# Patient Record
Sex: Female | Born: 1985 | Race: White | Hispanic: Yes | Marital: Married | Smoking: Never smoker
Health system: Southern US, Community
[De-identification: ages and names within clinical notes are randomized; demographics above are authoritative.]

## PROBLEM LIST (undated history)

## (undated) DIAGNOSIS — Z789 Other specified health status: Secondary | ICD-10-CM

## (undated) DIAGNOSIS — D649 Anemia, unspecified: Secondary | ICD-10-CM

---

## 2008-04-05 ENCOUNTER — Inpatient Hospital Stay (HOSPITAL_COMMUNITY): Admission: AD | Admit: 2008-04-05 | Discharge: 2008-04-05 | Payer: Self-pay | Admitting: Obstetrics and Gynecology

## 2008-05-29 ENCOUNTER — Ambulatory Visit (HOSPITAL_COMMUNITY): Admission: RE | Admit: 2008-05-29 | Discharge: 2008-05-29 | Payer: Self-pay | Admitting: Obstetrics & Gynecology

## 2008-06-29 ENCOUNTER — Ambulatory Visit (HOSPITAL_COMMUNITY): Admission: RE | Admit: 2008-06-29 | Discharge: 2008-06-29 | Payer: Self-pay | Admitting: Obstetrics & Gynecology

## 2008-08-19 ENCOUNTER — Ambulatory Visit (HOSPITAL_COMMUNITY): Admission: RE | Admit: 2008-08-19 | Discharge: 2008-08-19 | Payer: Self-pay | Admitting: Family Medicine

## 2008-08-26 ENCOUNTER — Ambulatory Visit: Payer: Self-pay | Admitting: Obstetrics and Gynecology

## 2008-08-26 ENCOUNTER — Inpatient Hospital Stay (HOSPITAL_COMMUNITY): Admission: AD | Admit: 2008-08-26 | Discharge: 2008-08-26 | Payer: Self-pay | Admitting: Family Medicine

## 2008-09-03 ENCOUNTER — Ambulatory Visit (HOSPITAL_COMMUNITY): Admission: RE | Admit: 2008-09-03 | Discharge: 2008-09-03 | Payer: Self-pay | Admitting: Obstetrics & Gynecology

## 2008-09-03 ENCOUNTER — Ambulatory Visit: Payer: Self-pay | Admitting: Family Medicine

## 2008-09-07 ENCOUNTER — Ambulatory Visit: Payer: Self-pay | Admitting: Obstetrics & Gynecology

## 2008-09-10 ENCOUNTER — Ambulatory Visit: Payer: Self-pay | Admitting: Family Medicine

## 2008-09-17 ENCOUNTER — Ambulatory Visit: Payer: Self-pay | Admitting: Family Medicine

## 2008-09-24 ENCOUNTER — Ambulatory Visit: Payer: Self-pay | Admitting: Family Medicine

## 2008-09-24 ENCOUNTER — Inpatient Hospital Stay (HOSPITAL_COMMUNITY): Admission: AD | Admit: 2008-09-24 | Discharge: 2008-09-27 | Payer: Self-pay | Admitting: Obstetrics & Gynecology

## 2008-09-24 ENCOUNTER — Ambulatory Visit: Payer: Self-pay | Admitting: Obstetrics and Gynecology

## 2008-11-06 ENCOUNTER — Inpatient Hospital Stay (HOSPITAL_COMMUNITY): Admission: AD | Admit: 2008-11-06 | Discharge: 2008-11-06 | Payer: Self-pay | Admitting: Obstetrics and Gynecology

## 2008-11-06 ENCOUNTER — Ambulatory Visit: Payer: Self-pay | Admitting: Family Medicine

## 2010-03-26 IMAGING — US US OB FOLLOW-UP
1 series · 14 of 28 positions shown · non-contrast
Comparison: none

OBSTETRICAL ULTRASOUND:
 This ultrasound exam was performed in the [HOSPITAL] Ultrasound Department.  The OB US report was generated in the AS system, and faxed to the ordering physician.  This report is also available in [REDACTED] PACS.

[Series 1: us ob re-eval · 36 acquisitions, 14 frames shown]
[im 2/36]
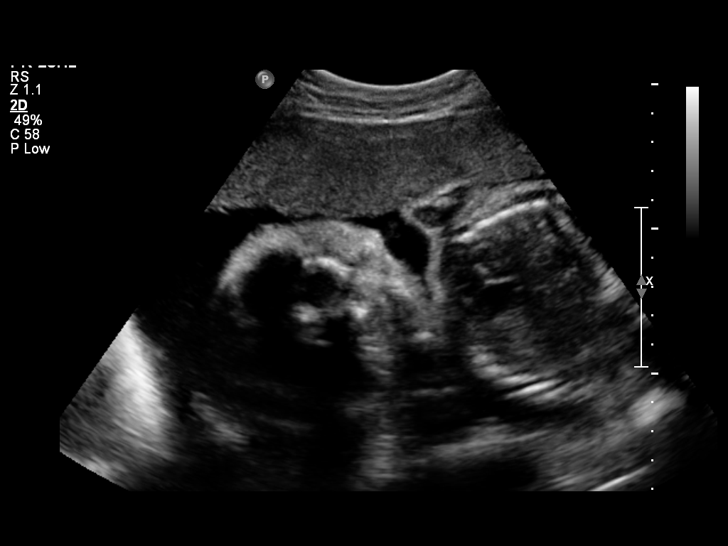
[im 4/36]
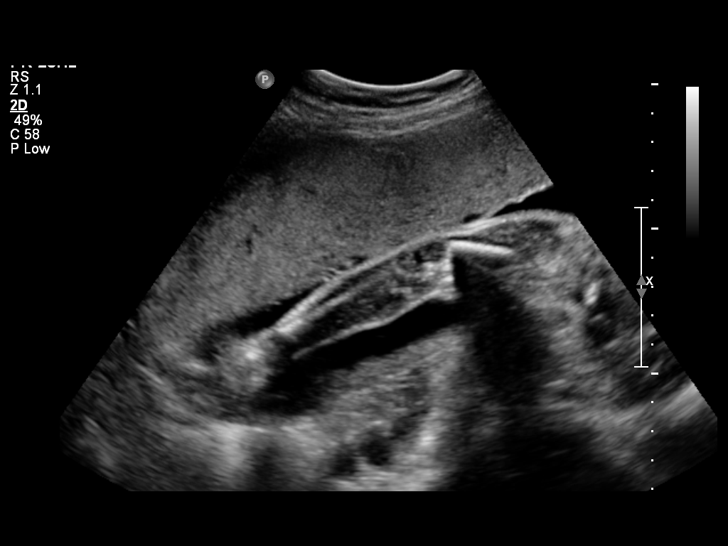
[im 7/36]
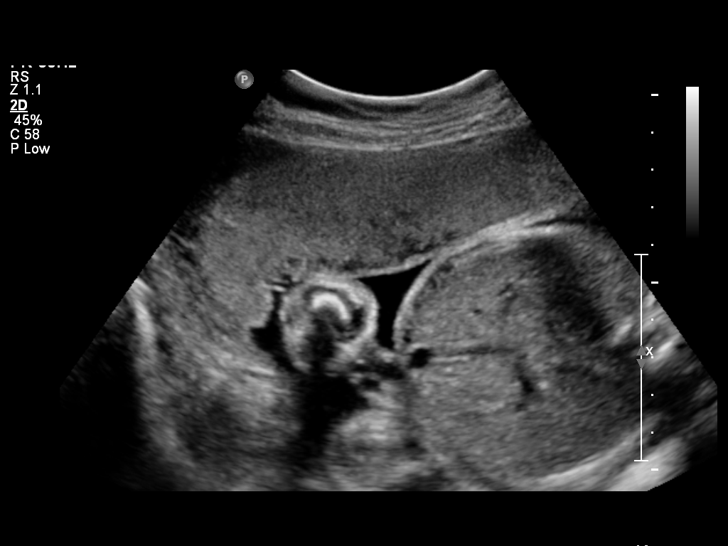
[im 10/36]
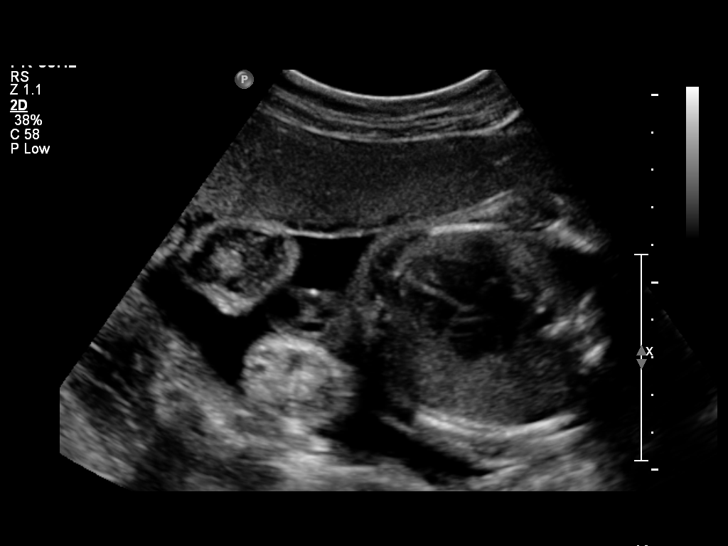
[im 12/36]
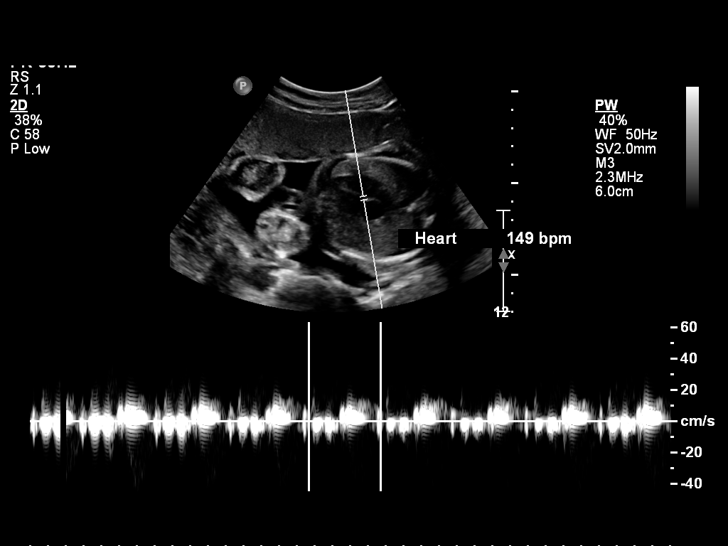
[im 15/36]
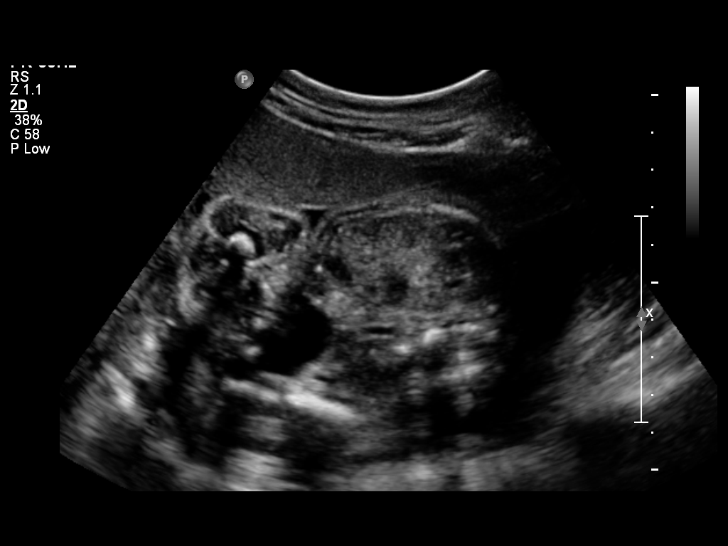
[im 17/36]
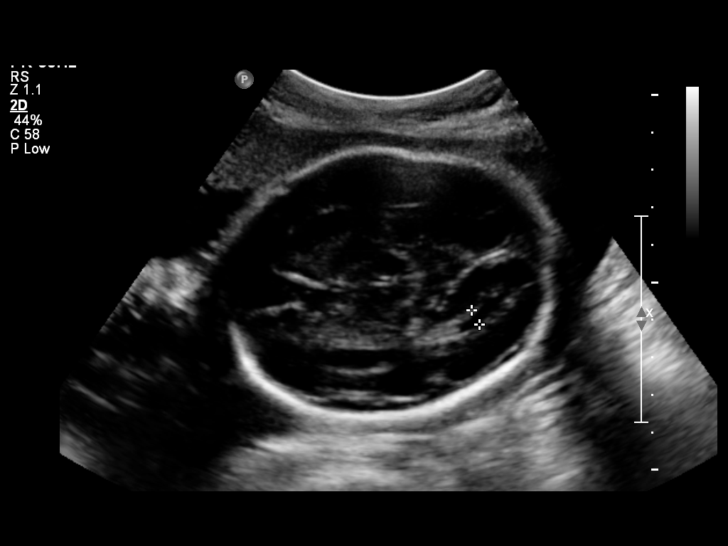
[im 20/36]
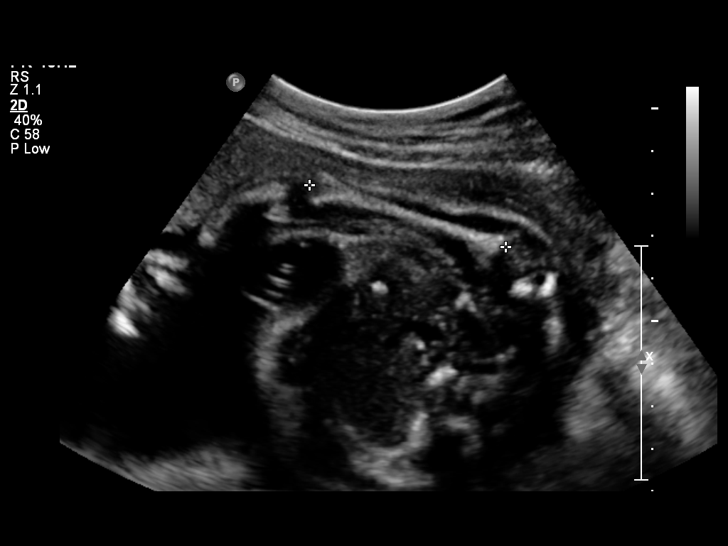
[im 23/36]
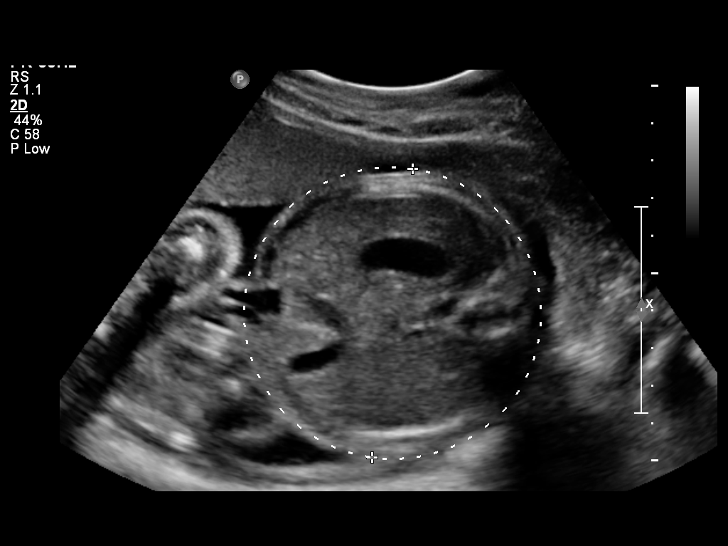
[im 25/36]
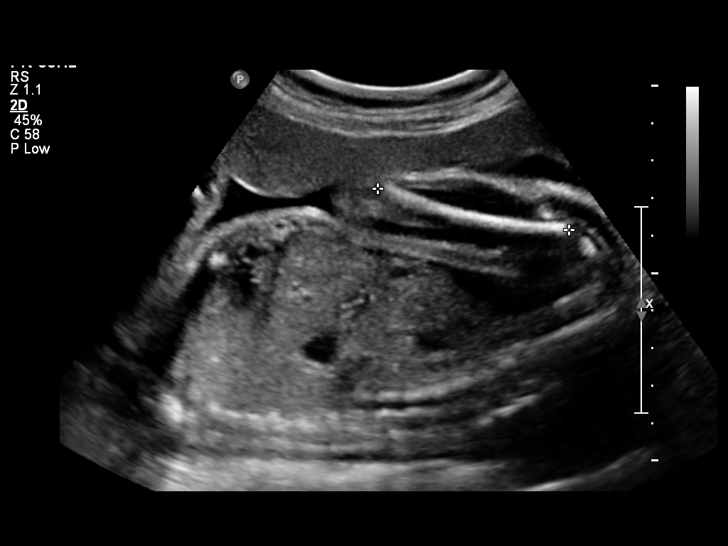
[im 28/36]
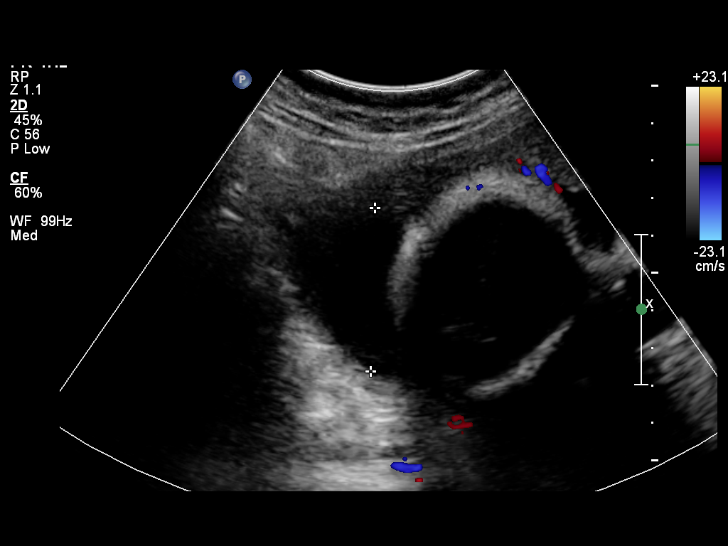
[im 30/36]
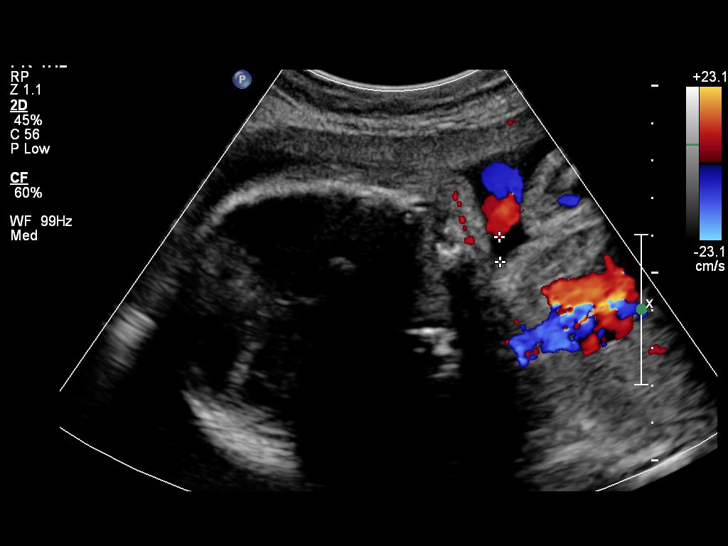
[im 33/36]
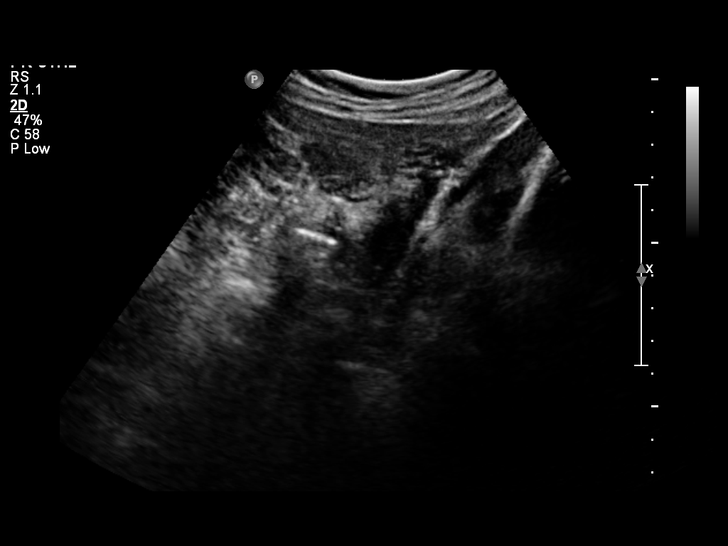
[im 36/36]
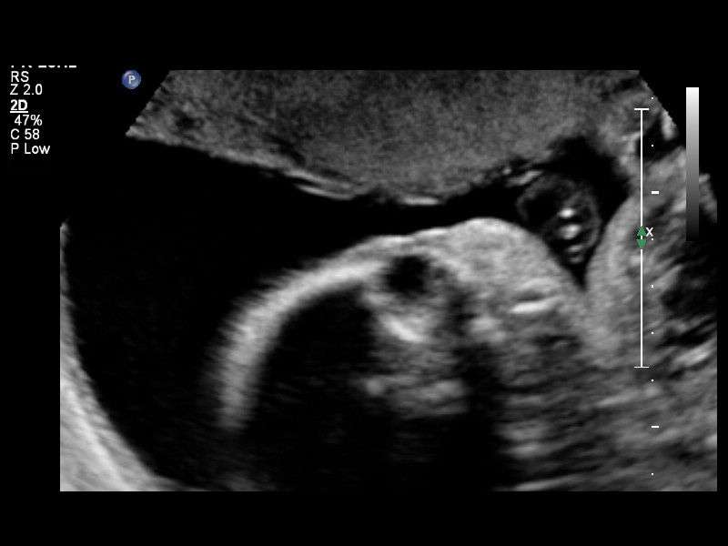

[14 of 28 positions shown; findings below may reference images not displayed]

IMPRESSION: See AS Obstetric US report.

## 2010-12-15 LAB — URINALYSIS, ROUTINE W REFLEX MICROSCOPIC
Nitrite: NEGATIVE
Specific Gravity, Urine: >1.03 — ABNORMAL HIGH (ref 1.005–1.030)
Urobilinogen, UA: 0.2 mg/dL (ref 0.0–1.0)
pH: 6 (ref 5.0–8.0)

## 2010-12-19 LAB — POCT URINALYSIS DIP (DEVICE)
Bilirubin Urine: NEGATIVE
Bilirubin Urine: NEGATIVE
Glucose, UA: NEGATIVE mg/dL
Glucose, UA: NEGATIVE mg/dL
Glucose, UA: NEGATIVE mg/dL
Hgb urine dipstick: NEGATIVE
Hgb urine dipstick: NEGATIVE
Ketones, ur: NEGATIVE mg/dL
Nitrite: NEGATIVE
Protein, ur: NEGATIVE mg/dL
Protein, ur: NEGATIVE mg/dL
Specific Gravity, Urine: 1.015 (ref 1.005–1.030)
pH: 7 (ref 5.0–8.0)

## 2010-12-19 LAB — CBC
MCHC: 33.9 g/dL (ref 30.0–36.0)
RBC: 3.61 MIL/uL — ABNORMAL LOW (ref 3.87–5.11)
RDW: 13.4 % (ref 11.5–15.5)

## 2010-12-19 LAB — RPR: RPR Ser Ql: NONREACTIVE

## 2011-06-02 LAB — URINALYSIS, ROUTINE W REFLEX MICROSCOPIC
Bilirubin Urine: NEGATIVE
Glucose, UA: NEGATIVE
Nitrite: NEGATIVE
Specific Gravity, Urine: 1.02

## 2011-06-09 LAB — POCT URINALYSIS DIP (DEVICE)
Glucose, UA: NEGATIVE mg/dL
Hgb urine dipstick: NEGATIVE
Ketones, ur: NEGATIVE mg/dL
Nitrite: NEGATIVE
Protein, ur: NEGATIVE mg/dL
Specific Gravity, Urine: 1.01 (ref 1.005–1.030)
pH: 7 (ref 5.0–8.0)

## 2012-07-23 ENCOUNTER — Other Ambulatory Visit (HOSPITAL_COMMUNITY): Payer: Self-pay | Admitting: Physician Assistant

## 2012-07-23 DIAGNOSIS — Z3689 Encounter for other specified antenatal screening: Secondary | ICD-10-CM

## 2012-07-23 LAB — OB RESULTS CONSOLE ANTIBODY SCREEN: Antibody Screen: NEGATIVE

## 2012-07-23 LAB — OB RESULTS CONSOLE RUBELLA ANTIBODY, IGM: Rubella: IMMUNE

## 2012-07-23 LAB — OB RESULTS CONSOLE GC/CHLAMYDIA
Chlamydia: NEGATIVE
Gonorrhea: NEGATIVE

## 2012-07-23 LAB — OB RESULTS CONSOLE HIV ANTIBODY (ROUTINE TESTING): HIV: NONREACTIVE

## 2012-07-24 ENCOUNTER — Ambulatory Visit (HOSPITAL_COMMUNITY)
Admission: RE | Admit: 2012-07-24 | Discharge: 2012-07-24 | Disposition: A | Discharge status: Home / Self Care | Payer: Self-pay | Source: Ambulatory Visit | Attending: Physician Assistant | Admitting: Physician Assistant

## 2012-07-24 ENCOUNTER — Encounter (HOSPITAL_COMMUNITY): Payer: Self-pay

## 2012-07-24 DIAGNOSIS — Z363 Encounter for antenatal screening for malformations: Secondary | ICD-10-CM | POA: Insufficient documentation

## 2012-07-24 DIAGNOSIS — Z1389 Encounter for screening for other disorder: Secondary | ICD-10-CM | POA: Insufficient documentation

## 2012-07-24 DIAGNOSIS — O0930 Supervision of pregnancy with insufficient antenatal care, unspecified trimester: Secondary | ICD-10-CM | POA: Insufficient documentation

## 2012-07-24 DIAGNOSIS — O358XX0 Maternal care for other (suspected) fetal abnormality and damage, not applicable or unspecified: Secondary | ICD-10-CM | POA: Insufficient documentation

## 2012-07-24 DIAGNOSIS — Z3689 Encounter for other specified antenatal screening: Secondary | ICD-10-CM

## 2012-09-04 NOTE — L&D Delivery Note (Signed)
Attestation of Attending Supervision of Advanced Practitioner: Evaluation and management procedures were performed by the PA/NP/CNM/OB Fellow under my supervision/collaboration. Chart reviewed and agree with management and plan.  Aino Heckert V 10/26/2012 5:50 AM   

## 2012-09-04 NOTE — L&D Delivery Note (Signed)
Haley Lin is a 27 y.o. G2P1001 presenting at [redacted]w[redacted]d by LMP presenting contracting and 8 cm dilated. Pt noted to have anterior lip in L&D room, AROM, clear, progressed to complete and pushing within minutes.  Delivery Note At 6:26 PM a viable female was delivered via Vaginal, Spontaneous Delivery (Presentation: vertex; ROA).  APGAR: 9,9; weight pending.   Placenta status: Intact, Spontaneous.  Cord: 3 vessels, Short.  Cord pH: n/a.  Complications:  none  Anesthesia: None  Episiotomy: None Lacerations: None Suture Repair: n/a Est. Blood Loss (mL): 300  Mom to postpartum.  Baby to nursery-stable.  Napoleon Form 10/25/2012, 7:04 PM

## 2012-10-25 ENCOUNTER — Inpatient Hospital Stay (HOSPITAL_COMMUNITY)
Admission: AD | Admit: 2012-10-25 | Discharge: 2012-10-27 | DRG: 775 | Disposition: A | Discharge status: Home / Self Care | Payer: Medicaid Other | Source: Ambulatory Visit | Attending: Obstetrics and Gynecology | Admitting: Obstetrics and Gynecology

## 2012-10-25 ENCOUNTER — Encounter (HOSPITAL_COMMUNITY): Payer: Self-pay | Admitting: *Deleted

## 2012-10-25 HISTORY — DX: Other specified health status: Z78.9

## 2012-10-25 LAB — COMPREHENSIVE METABOLIC PANEL
BUN: 9 mg/dL (ref 6–23)
CO2: 20 mEq/L (ref 19–32)
Chloride: 102 mEq/L (ref 96–112)
Creatinine, Ser: 0.64 mg/dL (ref 0.50–1.10)
GFR calc non Af Amer: >90 mL/min (ref >90)
Total Bilirubin: 0.4 mg/dL (ref 0.3–1.2)

## 2012-10-25 LAB — CBC
HCT: 35.9 % — ABNORMAL LOW (ref 36.0–46.0)
MCHC: 34.5 g/dL (ref 30.0–36.0)
MCV: 83.9 fL (ref 78.0–100.0)
RDW: 13.2 % (ref 11.5–15.5)

## 2012-10-25 LAB — ABO/RH: ABO/RH(D): O POS

## 2012-10-25 LAB — PROTEIN / CREATININE RATIO, URINE
Creatinine, Urine: 22.83 mg/dL
Total Protein, Urine: 22.4 mg/dL

## 2012-10-25 MED ORDER — FLEET ENEMA 7-19 GM/118ML RE ENEM: 1.0000 | ENEMA | Freq: Every day | RECTAL | Status: DC | PRN

## 2012-10-25 MED ORDER — SIMETHICONE 80 MG PO CHEW: 80.0000 mg | CHEWABLE_TABLET | ORAL | Status: DC | PRN

## 2012-10-25 MED ORDER — WITCH HAZEL-GLYCERIN EX PADS: 1.0000 "application " | MEDICATED_PAD | CUTANEOUS | Status: DC | PRN

## 2012-10-25 MED ORDER — MEASLES, MUMPS & RUBELLA VAC ~~LOC~~ INJ: 0.5000 mL | INJECTION | Freq: Once | SUBCUTANEOUS | Status: DC

## 2012-10-25 MED ORDER — IBUPROFEN 600 MG PO TABS
600.0000 mg | ORAL_TABLET | Freq: Four times a day (QID) | ORAL | Status: DC | PRN
  Filled 2012-10-25: qty 1

## 2012-10-25 MED ORDER — ONDANSETRON HCL 4 MG/2ML IJ SOLN: 4.0000 mg | Freq: Four times a day (QID) | INTRAMUSCULAR | Status: DC | PRN

## 2012-10-25 MED ORDER — LACTATED RINGERS IV SOLN: INTRAVENOUS | Status: DC

## 2012-10-25 MED ORDER — ONDANSETRON HCL 4 MG PO TABS: 4.0000 mg | ORAL_TABLET | ORAL | Status: DC | PRN

## 2012-10-25 MED ORDER — DIBUCAINE 1 % RE OINT: 1.0000 "application " | TOPICAL_OINTMENT | RECTAL | Status: DC | PRN

## 2012-10-25 MED ORDER — ONDANSETRON HCL 4 MG/2ML IJ SOLN: 4.0000 mg | INTRAMUSCULAR | Status: DC | PRN

## 2012-10-25 MED ORDER — MISOPROSTOL 200 MCG PO TABS
800.0000 ug | ORAL_TABLET | Freq: Once | ORAL | Status: AC
  Administered 2012-10-25: 800 ug via VAGINAL

## 2012-10-25 MED ORDER — ZOLPIDEM TARTRATE 5 MG PO TABS: 5.0000 mg | ORAL_TABLET | Freq: Every evening | ORAL | Status: DC | PRN

## 2012-10-25 MED ORDER — FLEET ENEMA 7-19 GM/118ML RE ENEM: 1.0000 | ENEMA | RECTAL | Status: DC | PRN

## 2012-10-25 MED ORDER — OXYTOCIN 40 UNITS IN LACTATED RINGERS INFUSION - SIMPLE MED: 62.5000 mL/h | INTRAVENOUS | Status: DC | PRN

## 2012-10-25 MED ORDER — BENZOCAINE-MENTHOL 20-0.5 % EX AERO: 1.0000 "application " | INHALATION_SPRAY | CUTANEOUS | Status: DC | PRN

## 2012-10-25 MED ORDER — OXYCODONE-ACETAMINOPHEN 5-325 MG PO TABS: 1.0000 | ORAL_TABLET | ORAL | Status: DC | PRN

## 2012-10-25 MED ORDER — OXYTOCIN BOLUS FROM INFUSION: 500.0000 mL | INTRAVENOUS | Status: DC

## 2012-10-25 MED ORDER — TETANUS-DIPHTH-ACELL PERTUSSIS 5-2.5-18.5 LF-MCG/0.5 IM SUSP: 0.5000 mL | Freq: Once | INTRAMUSCULAR | Status: DC

## 2012-10-25 MED ORDER — MISOPROSTOL 200 MCG PO TABS
ORAL_TABLET | ORAL | Status: AC
  Filled 2012-10-25: qty 4

## 2012-10-25 MED ORDER — CITRIC ACID-SODIUM CITRATE 334-500 MG/5ML PO SOLN: 30.0000 mL | ORAL | Status: DC | PRN

## 2012-10-25 MED ORDER — PRENATAL MULTIVITAMIN CH
1.0000 | ORAL_TABLET | Freq: Every day | ORAL | Status: DC
  Administered 2012-10-26 – 2012-10-27 (×2): 1 via ORAL
  Filled 2012-10-25 (×2): qty 1

## 2012-10-25 MED ORDER — LACTATED RINGERS IV SOLN: 500.0000 mL | INTRAVENOUS | Status: DC | PRN

## 2012-10-25 MED ORDER — OXYTOCIN 40 UNITS IN LACTATED RINGERS INFUSION - SIMPLE MED
62.5000 mL/h | INTRAVENOUS | Status: DC
  Administered 2012-10-25: 62.5 mL/h via INTRAVENOUS

## 2012-10-25 MED ORDER — FERROUS SULFATE 325 (65 FE) MG PO TABS
325.0000 mg | ORAL_TABLET | Freq: Two times a day (BID) | ORAL | Status: DC
  Administered 2012-10-26 – 2012-10-27 (×3): 325 mg via ORAL
  Filled 2012-10-25 (×3): qty 1

## 2012-10-25 MED ORDER — ACETAMINOPHEN 325 MG PO TABS: 650.0000 mg | ORAL_TABLET | ORAL | Status: DC | PRN

## 2012-10-25 MED ORDER — BISACODYL 10 MG RE SUPP: 10.0000 mg | Freq: Every day | RECTAL | Status: DC | PRN

## 2012-10-25 MED ORDER — LIDOCAINE HCL (PF) 1 % IJ SOLN: 30.0000 mL | INTRAMUSCULAR | Status: DC | PRN

## 2012-10-25 MED ORDER — NALBUPHINE SYRINGE 5 MG/0.5 ML: 10.0000 mg | INJECTION | INTRAMUSCULAR | Status: DC | PRN

## 2012-10-25 MED ORDER — OXYTOCIN 40 UNITS IN LACTATED RINGERS INFUSION - SIMPLE MED
INTRAVENOUS | Status: AC
  Filled 2012-10-25: qty 1000

## 2012-10-25 MED ORDER — SENNOSIDES-DOCUSATE SODIUM 8.6-50 MG PO TABS
2.0000 | ORAL_TABLET | Freq: Every day | ORAL | Status: DC
  Administered 2012-10-25 – 2012-10-26 (×2): 2 via ORAL

## 2012-10-25 MED ORDER — LANOLIN HYDROUS EX OINT: TOPICAL_OINTMENT | CUTANEOUS | Status: DC | PRN

## 2012-10-25 MED ORDER — DIPHENHYDRAMINE HCL 25 MG PO CAPS: 25.0000 mg | ORAL_CAPSULE | Freq: Four times a day (QID) | ORAL | Status: DC | PRN

## 2012-10-25 MED ORDER — IBUPROFEN 600 MG PO TABS
600.0000 mg | ORAL_TABLET | Freq: Four times a day (QID) | ORAL | Status: DC
  Administered 2012-10-25 – 2012-10-27 (×8): 600 mg via ORAL
  Filled 2012-10-25 (×7): qty 1

## 2012-10-25 MED ORDER — LIDOCAINE HCL (PF) 1 % IJ SOLN
INTRAMUSCULAR | Status: AC
  Filled 2012-10-25: qty 30

## 2012-10-25 NOTE — MAU Note (Signed)
Interpreter paged, will come help RN's triage pt in room. Pt states back pain, denies lof or bleeding. U/c's began earlier today.

## 2012-10-25 NOTE — H&P (Signed)
Haley Lin is a 27 y.o. female G2P1001 at [redacted]w[redacted]d by LMP presenting for contractions starting at 5 AM and increasing in intensity around 3 PM. Pt gets prenatal care at [facility]., no complication of this pregnancy or prior pregnancies.  . Maternal Medical History:  Reason for admission: Contractions.   Contractions: Onset was 13-24 hours ago.   Frequency: regular.   Perceived severity is strong.    Fetal activity: Perceived fetal activity is normal.   Last perceived fetal movement was within the past hour.    Prenatal complications: no prenatal complications Prenatal Complications - Diabetes: none.    OB History   Grav Para Term Preterm Abortions TAB SAB Ect Mult Living   2 2 2  0 0 0 0 0 0 2     Past Medical History  Diagnosis Date  . Medical history non-contributory    Past Surgical History  Procedure Laterality Date  . No past surgeries     Family History: family history is not on file. Social History:  reports that she has never smoked. She has never used smokeless tobacco. She reports that she does not drink alcohol or use illicit drugs.   Prenatal Transfer Tool  Maternal Diabetes: No Genetic Screening: Declined Maternal Ultrasounds/Referrals: Normal Fetal Ultrasounds or other Referrals:  None Maternal Substance Abuse:  No Significant Maternal Medications:  None Significant Maternal Lab Results:  Lab values include: Group B Strep negative Other Comments:  None  ROS:   See HPI  Dilation: 10 Effacement (%): 100 Station: +2 Exam by:: Thad Ranger, MC Blood pressure 133/78, pulse 71, temperature 97.2 F (36.2 C), temperature source Oral, resp. rate 20, height 5\' 2"  (1.575 m), weight 61.236 kg (135 lb), unknown if currently breastfeeding. Exam Physical Exam  Constitutional: She is oriented to person, place, and time. She appears well-developed and well-nourished. She appears distressed (pain with contractions).  HENT:  Head: Normocephalic and atraumatic.   Eyes: Conjunctivae and EOM are normal.  Neck: Normal range of motion. Neck supple.  Cardiovascular: Normal rate.   Respiratory: Effort normal. No respiratory distress.  Musculoskeletal: She exhibits no edema and no tenderness.  Neurological: She is alert and oriented to person, place, and time.  Skin: Skin is warm and dry.  Psychiatric: She has a normal mood and affect.    Cervix 8/100/0 in MAU. 9.5/100/+1 on reexam in room   Prenatal labs: ABO, Rh: O/Positive/-- (11/19 0000) Antibody: Negative (11/19 0000) Rubella: Immune (11/19 0000) RPR: Nonreactive (11/19 0000)  HBsAg: Negative (11/19 0000)  HIV: Non-reactive (11/19 0000)  GBS: Negative (02/05 0000)   Assessment/Plan: 27 y.o. G2P200 at [redacted]w[redacted]d  in active, advanced labor Admitted to L&D Labs ordered Expect SVD imminently. GBS negative  Napoleon Form 10/25/2012, 6:54 PM

## 2012-10-26 NOTE — Progress Notes (Signed)
Patient was referred for history of depression/anxiety.  * Referral screened out by Clinical Social Worker because none of the following criteria appear to apply:  ~ History of anxiety/depression during this pregnancy, or of post-partum depression.  ~ Diagnosis of anxiety and/or depression within last 3 years  ~ History of depression due to pregnancy loss/loss of child  OR  * Patient's symptoms currently being treated with medication and/or therapy.  Please contact the Clinical Social Worker if needs arise, or by the patient's request.  Pt denies history of PP depression. Conflicting information noted in chart regarding PP depression history, as per chart review.       

## 2012-10-26 NOTE — Progress Notes (Signed)
Post Partum Day 1,  Subjective: no complaints  Objective: Temp:  [97.2 F (36.2 C)-99.4 F (37.4 C)] 98.2 F (36.8 C) (02/22 0553) Pulse Rate:  [67-144] 67 (02/22 0553) Resp:  [16-20] 18 (02/22 0553) BP: (115-160)/(73-92) 118/82 mmHg (02/22 0553) Weight:  [61.236 kg (135 lb)] 61.236 kg (135 lb) (02/21 1811)  Physical Exam:  General: alert, cooperative and no distress Lochia: appropriate Uterine Fundus: firm Incision: n/a DVT Evaluation: No evidence of DVT seen on physical exam.   Recent Labs  10/25/12 1810  HGB 12.4  HCT 35.9*    Assessment/Plan: Plan for discharge tomorrow and Breastfeeding Contraception - pills    LOS: 1 day   Mat Carne 10/26/2012, 7:23 AM   Evaluation and management procedures were performed by Resident physician under my supervision/collaboration. Chart reviewed, patient examined by me and I agree with management and plan. Isolated BP elevation x 2: will follow.

## 2012-10-26 NOTE — H&P (Signed)
Attestation of Attending Supervision of Advanced Practitioner: Evaluation and management procedures were performed by the PA/NP/CNM/OB Fellow under my supervision/collaboration. Chart reviewed and agree with management and plan.  Laban Orourke V 10/26/2012 5:49 AM   

## 2012-10-27 MED ORDER — DOCUSATE SODIUM 100 MG PO CAPS: 100.0000 mg | ORAL_CAPSULE | Freq: Two times a day (BID) | ORAL | Status: DC | PRN

## 2012-10-27 MED ORDER — IBUPROFEN 600 MG PO TABS: 600.0000 mg | ORAL_TABLET | Freq: Four times a day (QID) | ORAL | Status: DC

## 2012-10-27 MED ORDER — NORETHINDRONE 0.35 MG PO TABS: 1.0000 | ORAL_TABLET | Freq: Every day | ORAL | Status: DC

## 2012-10-27 NOTE — Discharge Summary (Signed)
Attestation of Attending Supervision of Advanced Practitioner: Evaluation and management procedures were performed by the PA/NP/CNM/OB Fellow under my supervision/collaboration. Chart reviewed and agree with management and plan.  Tilda Burrow 10/27/2012 10:33 PM

## 2012-10-27 NOTE — Discharge Summary (Signed)
Obstetric Discharge Summary Haley Lin is a 27 y.o. [id number] presenting at [redacted]w[redacted]d in active labor at 9 cm. She has a NSVD with no complications and uneventful postpartum course. [facility] She is breastfeeding and plans OCPs for contraception.  Reason for Admission: onset of labor Prenatal Procedures: none Intrapartum Procedures: spontaneous vaginal delivery Postpartum Procedures: none Complications-Operative and Postpartum: none Hemoglobin  Date Value Range Status  10/25/2012 12.4  12.0 - 15.0 g/dL Final     HCT  Date Value Range Status  10/25/2012 35.9* 36.0 - 46.0 % Final    Physical Exam:  General: alert, cooperative and no distress Lochia: appropriate Uterine Fundus: firm Incision: n/a DVT Evaluation: No evidence of DVT seen on physical exam. Negative Homan's sign. No cords or calf tenderness. No significant calf/ankle edema.  Discharge Diagnoses: Term Pregnancy-delivered  Discharge Information: Date: 10/27/2012 Activity: pelvic rest Diet: routine Medications: PNV, Ibuprofen and Colace, Micronor Condition: stable Instructions: refer to practice specific booklet Discharge to: home Follow-up Information   Follow up with [facility] [place] In 6 weeks.   Contact information:   [address] [place] [address] [phone]      Newborn Data: Live born female  Birth Weight: 7 lb 10.6 oz (3475 g) APGAR: 9,   Home with mother.  Napoleon Form 10/27/2012, 8:47 AM

## 2012-10-28 NOTE — Progress Notes (Signed)
Post discharge chart review completed.  

## 2012-11-06 ENCOUNTER — Telehealth (HOSPITAL_COMMUNITY): Payer: Self-pay | Admitting: *Deleted

## 2012-11-06 NOTE — Telephone Encounter (Signed)
Resolve episode 

## 2014-07-06 ENCOUNTER — Encounter (HOSPITAL_COMMUNITY): Payer: Self-pay | Admitting: *Deleted

## 2015-03-16 ENCOUNTER — Other Ambulatory Visit: Payer: Self-pay | Admitting: "Women's Health Care

## 2015-03-16 DIAGNOSIS — N63 Unspecified lump in unspecified breast: Secondary | ICD-10-CM

## 2015-03-24 ENCOUNTER — Ambulatory Visit
Admission: RE | Admit: 2015-03-24 | Discharge: 2015-03-24 | Disposition: A | Discharge status: Home / Self Care | Payer: No Typology Code available for payment source | Source: Ambulatory Visit | Attending: "Women's Health Care | Admitting: "Women's Health Care

## 2015-03-24 DIAGNOSIS — N63 Unspecified lump in unspecified breast: Secondary | ICD-10-CM

## 2015-09-05 NOTE — L&D Delivery Note (Signed)
Patient is 30 y.o. [id number] 5360w1d admitted for SOL.  Delivery Note At 12:57 PM a viable female was delivered via Vaginal, Spontaneous Delivery (Presentation: ROA ).  APGAR: 9, 9; weight  pending.   Placenta status: intact.  Cord: 3 vessel. Without complications.  Anesthesia:  none Episiotomy: None Lacerations: None Suture Repair: none Est. Blood Loss (mL):  100mL  Mom to postpartum.  Baby to Couplet care / Skin to Skin.  Leland HerElsia J Lynnet Hefley 05/15/2016, 1:10 PM      Upon arrival patient was complete and pushing. She pushed with good maternal effort to deliver a healthy baby boy. Baby delivered without difficulty, was noted to have good tone and place on maternal abdomen for oral suctioning, drying and stimulation. Delayed cord clamping performed. Placenta delivered intact with 3V cord. Vaginal canal and perineum was inspected and no repairs deemed necessary; hemostatic. Pitocin was started and uterus massaged until bleeding slowed. Counts of sharps, instruments, and lap pads were all correct.   Leland HerElsia J Brevyn Ring, DO PGY-1 9/11/20171:10 PM

## 2015-11-15 LAB — OB RESULTS CONSOLE GBS: STREP GROUP B AG: POSITIVE

## 2015-11-16 ENCOUNTER — Other Ambulatory Visit (HOSPITAL_COMMUNITY): Payer: Self-pay | Admitting: Nurse Practitioner

## 2015-11-16 DIAGNOSIS — Z3682 Encounter for antenatal screening for nuchal translucency: Secondary | ICD-10-CM

## 2015-11-16 DIAGNOSIS — Z3A13 13 weeks gestation of pregnancy: Secondary | ICD-10-CM

## 2015-11-19 ENCOUNTER — Encounter (HOSPITAL_COMMUNITY): Payer: Self-pay

## 2015-11-19 ENCOUNTER — Ambulatory Visit (HOSPITAL_COMMUNITY)
Admission: RE | Admit: 2015-11-19 | Discharge: 2015-11-19 | Disposition: A | Discharge status: Home / Self Care | Payer: Medicaid Other | Source: Ambulatory Visit | Attending: Nurse Practitioner | Admitting: Nurse Practitioner

## 2015-11-19 DIAGNOSIS — Z36 Encounter for antenatal screening of mother: Secondary | ICD-10-CM | POA: Insufficient documentation

## 2015-11-19 DIAGNOSIS — Z3A13 13 weeks gestation of pregnancy: Secondary | ICD-10-CM | POA: Insufficient documentation

## 2015-11-19 DIAGNOSIS — Z3682 Encounter for antenatal screening for nuchal translucency: Secondary | ICD-10-CM

## 2015-11-25 ENCOUNTER — Other Ambulatory Visit (HOSPITAL_COMMUNITY): Payer: Self-pay

## 2016-05-15 ENCOUNTER — Inpatient Hospital Stay (HOSPITAL_COMMUNITY)
Admission: AD | Admit: 2016-05-15 | Discharge: 2016-05-17 | DRG: 775 | Disposition: A | Discharge status: Home / Self Care | Payer: Medicaid Other | Source: Ambulatory Visit | Attending: Family Medicine | Admitting: Family Medicine

## 2016-05-15 ENCOUNTER — Encounter (HOSPITAL_COMMUNITY): Payer: Self-pay | Admitting: *Deleted

## 2016-05-15 DIAGNOSIS — IMO0001 Reserved for inherently not codable concepts without codable children: Secondary | ICD-10-CM

## 2016-05-15 DIAGNOSIS — O99824 Streptococcus B carrier state complicating childbirth: Secondary | ICD-10-CM | POA: Diagnosis present

## 2016-05-15 DIAGNOSIS — Z3A39 39 weeks gestation of pregnancy: Secondary | ICD-10-CM

## 2016-05-15 DIAGNOSIS — Z3483 Encounter for supervision of other normal pregnancy, third trimester: Secondary | ICD-10-CM | POA: Diagnosis present

## 2016-05-15 LAB — CBC
HEMATOCRIT: 33.4 % — AB (ref 36.0–46.0)
Hemoglobin: 11.7 g/dL — ABNORMAL LOW (ref 12.0–15.0)
MCH: 28.9 pg (ref 26.0–34.0)
MCHC: 35 g/dL (ref 30.0–36.0)
MCV: 82.5 fL (ref 78.0–100.0)
PLATELETS: 261 10*3/uL (ref 150–400)
RBC: 4.05 MIL/uL (ref 3.87–5.11)
RDW: 13.5 % (ref 11.5–15.5)
WBC: 13.5 10*3/uL — AB (ref 4.0–10.5)

## 2016-05-15 LAB — TYPE AND SCREEN
ABO/RH(D): O POS
Antibody Screen: NEGATIVE

## 2016-05-15 MED ORDER — SIMETHICONE 80 MG PO CHEW: 80.0000 mg | CHEWABLE_TABLET | ORAL | Status: DC | PRN

## 2016-05-15 MED ORDER — ONDANSETRON HCL 4 MG PO TABS: 4.0000 mg | ORAL_TABLET | ORAL | Status: DC | PRN

## 2016-05-15 MED ORDER — OXYTOCIN BOLUS FROM INFUSION: 500.0000 mL | Freq: Once | INTRAVENOUS | Status: DC

## 2016-05-15 MED ORDER — ZOLPIDEM TARTRATE 5 MG PO TABS: 5.0000 mg | ORAL_TABLET | Freq: Every evening | ORAL | Status: DC | PRN

## 2016-05-15 MED ORDER — ACETAMINOPHEN 325 MG PO TABS: 650.0000 mg | ORAL_TABLET | ORAL | Status: DC | PRN

## 2016-05-15 MED ORDER — ONDANSETRON HCL 4 MG/2ML IJ SOLN: 4.0000 mg | Freq: Four times a day (QID) | INTRAMUSCULAR | Status: DC | PRN

## 2016-05-15 MED ORDER — LACTATED RINGERS IV SOLN
INTRAVENOUS | Status: DC
  Administered 2016-05-15: 12:00:00 via INTRAVENOUS

## 2016-05-15 MED ORDER — FENTANYL CITRATE (PF) 100 MCG/2ML IJ SOLN
INTRAMUSCULAR | Status: AC
  Administered 2016-05-15: 100 ug via INTRAVENOUS
  Filled 2016-05-15: qty 2

## 2016-05-15 MED ORDER — COCONUT OIL OIL: 1.0000 "application " | TOPICAL_OIL | Status: DC | PRN

## 2016-05-15 MED ORDER — LIDOCAINE HCL (PF) 1 % IJ SOLN
INTRAMUSCULAR | Status: AC
  Filled 2016-05-15: qty 30

## 2016-05-15 MED ORDER — TETANUS-DIPHTH-ACELL PERTUSSIS 5-2.5-18.5 LF-MCG/0.5 IM SUSP: 0.5000 mL | Freq: Once | INTRAMUSCULAR | Status: DC

## 2016-05-15 MED ORDER — FENTANYL CITRATE (PF) 100 MCG/2ML IJ SOLN
100.0000 ug | INTRAMUSCULAR | Status: DC | PRN
  Administered 2016-05-15: 100 ug via INTRAVENOUS

## 2016-05-15 MED ORDER — DIPHENHYDRAMINE HCL 25 MG PO CAPS: 25.0000 mg | ORAL_CAPSULE | Freq: Four times a day (QID) | ORAL | Status: DC | PRN

## 2016-05-15 MED ORDER — LACTATED RINGERS IV SOLN: 500.0000 mL | INTRAVENOUS | Status: DC | PRN

## 2016-05-15 MED ORDER — LIDOCAINE HCL (PF) 1 % IJ SOLN
30.0000 mL | INTRAMUSCULAR | Status: DC | PRN
  Filled 2016-05-15: qty 30

## 2016-05-15 MED ORDER — SOD CITRATE-CITRIC ACID 500-334 MG/5ML PO SOLN: 30.0000 mL | ORAL | Status: DC | PRN

## 2016-05-15 MED ORDER — OXYTOCIN 40 UNITS IN LACTATED RINGERS INFUSION - SIMPLE MED
INTRAVENOUS | Status: AC
  Filled 2016-05-15: qty 1000

## 2016-05-15 MED ORDER — BENZOCAINE-MENTHOL 20-0.5 % EX AERO: 1.0000 "application " | INHALATION_SPRAY | CUTANEOUS | Status: DC | PRN

## 2016-05-15 MED ORDER — ONDANSETRON HCL 4 MG/2ML IJ SOLN: 4.0000 mg | INTRAMUSCULAR | Status: DC | PRN

## 2016-05-15 MED ORDER — WITCH HAZEL-GLYCERIN EX PADS: 1.0000 "application " | MEDICATED_PAD | CUTANEOUS | Status: DC | PRN

## 2016-05-15 MED ORDER — FLEET ENEMA 7-19 GM/118ML RE ENEM: 1.0000 | ENEMA | RECTAL | Status: DC | PRN

## 2016-05-15 MED ORDER — OXYTOCIN 40 UNITS IN LACTATED RINGERS INFUSION - SIMPLE MED: 2.5000 [IU]/h | INTRAVENOUS | Status: DC

## 2016-05-15 MED ORDER — IBUPROFEN 600 MG PO TABS
600.0000 mg | ORAL_TABLET | Freq: Four times a day (QID) | ORAL | Status: DC
  Administered 2016-05-15 – 2016-05-17 (×7): 600 mg via ORAL
  Filled 2016-05-15 (×7): qty 1

## 2016-05-15 MED ORDER — OXYCODONE-ACETAMINOPHEN 5-325 MG PO TABS: 2.0000 | ORAL_TABLET | ORAL | Status: DC | PRN

## 2016-05-15 MED ORDER — AMPICILLIN SODIUM 2 G IJ SOLR
2.0000 g | Freq: Once | INTRAMUSCULAR | Status: AC
  Administered 2016-05-15: 2 g via INTRAVENOUS
  Filled 2016-05-15: qty 2000

## 2016-05-15 MED ORDER — PRENATAL MULTIVITAMIN CH
1.0000 | ORAL_TABLET | Freq: Every day | ORAL | Status: DC
  Administered 2016-05-16: 1 via ORAL
  Filled 2016-05-15: qty 1

## 2016-05-15 MED ORDER — OXYCODONE-ACETAMINOPHEN 5-325 MG PO TABS
1.0000 | ORAL_TABLET | ORAL | Status: DC | PRN
Start: 2016-05-15 — End: 2016-05-15

## 2016-05-15 MED ORDER — SENNOSIDES-DOCUSATE SODIUM 8.6-50 MG PO TABS
2.0000 | ORAL_TABLET | ORAL | Status: DC
  Administered 2016-05-15 – 2016-05-16 (×2): 2 via ORAL
  Filled 2016-05-15 (×2): qty 2

## 2016-05-15 MED ORDER — DIBUCAINE 1 % RE OINT: 1.0000 "application " | TOPICAL_OINTMENT | RECTAL | Status: DC | PRN

## 2016-05-15 NOTE — H&P (Signed)
LABOR AND DELIVERY ADMISSION HISTORY AND PHYSICAL NOTE  Haley Lin is a 30 y.o. female G3P2002 with IUP at 185w1d by LMP and 13.4 wk U/S presenting for SOL.   She reports positive fetal movement. She denies vaginal bleeding. Has had LOF, clear since around 930am  Prenatal History/Complications: none  Past Medical History: Past Medical History:  Diagnosis Date  . Medical history non-contributory     Past Surgical History: Past Surgical History:  Procedure Laterality Date  . NO PAST SURGERIES      Obstetrical History: OB History    Gravida Para Term Preterm AB Living   3 2 2  0 0 2   SAB TAB Ectopic Multiple Live Births   0 0 0 0 1      Social History: Social History   Social History  . Marital status: Single    Spouse name: N/A  . Number of children: N/A  . Years of education: N/A   Social History Main Topics  . Smoking status: Never Smoker  . Smokeless tobacco: Never Used  . Alcohol use No  . Drug use: No  . Sexual activity: Yes    Birth control/ protection: None   Other Topics Concern  . None   Social History Narrative  . None    Family History: History reviewed. No pertinent family history.  Allergies: No Known Allergies  Prescriptions Prior to Admission  Medication Sig Dispense Refill Last Dose  . Prenatal Vit-Fe Fumarate-FA (PRENATAL MULTIVITAMIN) TABS Take 1 tablet by mouth at bedtime.    05/14/2016 at Unknown time     Review of Systems   All systems reviewed and negative except as stated in HPI  Blood pressure 146/87, pulse 70, temperature 98.3 F (36.8 C), temperature source Oral, resp. rate 18, height 4' 8.75" (1.441 m), weight 64.9 kg (143 lb), last menstrual period 08/15/2015, unknown if currently breastfeeding. General appearance: alert, cooperative and no distress Lungs: no respiratory distress Heart: regular rate Abdomen: soft, non-tender Extremities: No calf swelling or tenderness Presentation: cephalic by cervical  exam Fetal monitoring: 130 baseline, moderate variability, no accelerations or decelerations Uterine activity: q1-193min Dilation: 6 Effacement (%): 100 Station: -1 Exam by:: Sarajane MarekS. Carrera, RNC   Prenatal labs: ABO, Rh:  O pos Antibody:  neg Rubella: immune RPR:   neg HBsAg:   neg HIV:   nonreactive GBS: Positive (03/13 0000)  Genetic screening:  neg Anatomy US: normal  Prenatal Transfer Tool  Maternal Diabetes: No Genetic Screening: Normal Maternal Ultrasounds/Referrals: Normal Fetal Ultrasounds or other Referrals:  None Maternal Substance Abuse:  No Significant Maternal Medications:  None Significant Maternal Lab Results: Lab values include: Group B Strep positive  No results found for this or any previous visit (from the past 24 hour(s)).  Patient Active Problem List   Diagnosis Date Noted  . Active labor 05/15/2016  . SVD (spontaneous vaginal delivery) 10/27/2012    Assessment: Haley Lin is a 30 y.o. G3P2002 at 6985w1d here for SOL  #Labor:expectant management #Pain: Declined epidural, is considering fentanyl #FWB:  category I #ID:  GBS pos #MOF: breast #MOC:undecided  #Circ:  outpatient  Leland HerElsia J Yoo, DO PGY-1 9/11/201711:34 AM   OB FELLOW HISTORY AND PHYSICAL ATTESTATION  I have seen and examined this patient; I agree with above documentation in the resident's note.    Jen MowElizabeth Teyanna Thielman, DO OB Fellow 05/15/2016, 7:10 PM

## 2016-05-15 NOTE — Lactation Note (Signed)
This note was copied from a baby's chart. Lactation Consultation Note  Bonnye FavaViria interpreter present. Baby 4 hours old.  P3.  Ex BF.  Breastfed other children 1.5 years. Reviewed hand expression and mother easily expessed flow of colostrum. Mother latched baby in cradle hold.  Intermittent sucks and swallows observed. Discussed basics including cluster feeding. Mom encouraged to feed baby 8-12 times/24 hours and with feeding cues.  Mom made aware of O/P services, breastfeeding support groups, community resources, and our phone # for post-discharge questions.    Patient Name: Haley Keturah BarreGloria Gabriel-Santiago ZOXWR'UToday's Date: 05/15/2016 Reason for consult: Initial assessment   Maternal Data Has patient been taught Hand Expression?: Yes Does the patient have breastfeeding experience prior to this delivery?: Yes  Feeding Feeding Type: Breast Fed Length of feed: 30 min  LATCH Score/Interventions Latch: Grasps breast easily, tongue down, lips flanged, rhythmical sucking.  Audible Swallowing: A few with stimulation  Type of Nipple: Everted at rest and after stimulation  Comfort (Breast/Nipple): Soft / non-tender     Hold (Positioning): No assistance needed to correctly position infant at breast.  LATCH Score: 9  Lactation Tools Discussed/Used     Consult Status Consult Status: Follow-up Date: 05/16/16 Follow-up type: In-patient    Dahlia ByesBerkelhammer, Deitrich Steve [facility] 05/15/2016, 5:51 PM

## 2016-05-15 NOTE — Progress Notes (Signed)
Interpretor at bedside. 

## 2016-05-15 NOTE — Progress Notes (Signed)
I assisted RN from Nursery with information about the Vitamin K and first dose for Hepatitis B vaccine, by Orlan LeavensViria Alvarez Spanish Interpreter.

## 2016-05-15 NOTE — Plan of Care (Signed)
Problem: Education: Goal: Knowledge of condition will improve Outcome: Completed/Met Date Met: 05/15/16 Spanish interpreter present for teaching.

## 2016-05-16 LAB — RPR: RPR: NONREACTIVE

## 2016-05-16 NOTE — Progress Notes (Signed)
UR chart review completed.  

## 2016-05-16 NOTE — Lactation Note (Signed)
This note was copied from a baby's chart. Lactation Consultation Note: Follow up visit with mom. Dad translated for me. Reports baby just finished feeding for 20 min and is going to sleep on mom's chest. Experienced BF mom.  Reports baby is latching well with no pain. No questions at present. To call for assist prn  Patient Name: Boy Keturah BarreGloria Gabriel-Santiago ZOXWR'UToday's Date: 05/16/2016 Reason for consult: Follow-up assessment   Maternal Data Formula Feeding for Exclusion: No Does the patient have breastfeeding experience prior to this delivery?: Yes  Feeding Feeding Type: Breast Fed Length of feed: 30 min  LATCH Score/Interventions Latch: Repeated attempts needed to sustain latch, nipple held in mouth throughout feeding, stimulation needed to elicit sucking reflex. Intervention(s): Adjust position;Assist with latch;Breast compression  Audible Swallowing: A few with stimulation Intervention(s): Skin to skin;Hand expression  Type of Nipple: Everted at rest and after stimulation  Comfort (Breast/Nipple): Soft / non-tender     Hold (Positioning): Assistance needed to correctly position infant at breast and maintain latch. Intervention(s): Breastfeeding basics reviewed;Support Pillows;Position options;Skin to skin  LATCH Score: 7  Lactation Tools Discussed/Used     Consult Status Consult Status: PRN    Pamelia HoitWeeks, Viren Lebeau D 05/16/2016, 12:04 PM

## 2016-05-16 NOTE — Progress Notes (Signed)
POSTPARTUM PROGRESS NOTE  Post Partum Day 1  Subjective:  Haley Lin is a 30 y.o. G3P3003 3161w1d s/p SVD.  No acute events overnight.  Pt denies problems with ambulating, voiding or po intake.  She denies nausea or vomiting.  Pain is well controlled.   Lochia Moderate.   Objective: Blood pressure 106/68, pulse 63, temperature 98 F (36.7 C), temperature source Oral, resp. rate 18, height 4' 8.75" (1.441 m), weight 143 lb (64.9 kg), last menstrual period 08/15/2015, unknown if currently breastfeeding.  Physical Exam:  General: alert, cooperative and no distress Lochia:normal flow Chest: no respiratory distress Heart:regular rate, distal pulses intact Abdomen: soft, nontender,  Uterine Fundus: firm, appropriately tender DVT Evaluation: No calf swelling or tenderness Extremities: no edema   Recent Labs  05/15/16 1130  HGB 11.7*  HCT 33.4*    Assessment/Plan:  ASSESSMENT: Haley Lin is a 30 y.o. G3P3003 6861w1d s/p NSVD. Doing well. Breastfeeding. Will do condoms for contraception.   Plan for discharge tomorrow   LOS: 1 day   Kandra NicolasJulie P DegeleMD 05/16/2016, 8:47 AM    Midwife attestation Post Partum Day 1 I have seen and examined this patient and agree with above documentation in the resident's note.   Haley Lin is a 30 y.o. G3P3003 s/p SVD.  Pt denies problems with ambulating, voiding or po intake. Pain is well controlled.  Plan for birth control is condoms.  Method of Feeding: breast  PE:  BP 106/68   Pulse 63   Temp 98 F (36.7 C) (Oral)   Resp 18   Ht 4' 8.75" (1.441 m)   Wt 64.9 kg (143 lb)   LMP 08/15/2015   Breastfeeding? Unknown   BMI 31.22 kg/m  Gen: well appearing Heart: reg rate Lungs: normal WOB Fundus firm Ext: soft, no pain, no edema  Plan for discharge: tomorrow  Donette LarryMelanie Nathanyl Andujo, CNM 8:53 AM

## 2016-05-17 MED ORDER — IBUPROFEN 600 MG PO TABS: 600.0000 mg | ORAL_TABLET | Freq: Four times a day (QID) | ORAL | 0 refills | Status: DC

## 2016-05-17 NOTE — Discharge Summary (Signed)
OB Discharge Summary     Patient Name: Haley Lin DOB: 14-Nov-1985 MRN: [id number]  Date of admission: 05/15/2016 Delivering MD: Michaele Offer   Date of discharge: 05/17/2016  Admitting diagnosis: 29WKS,LABOR Intrauterine pregnancy: [redacted]w[redacted]d     Secondary diagnosis:  Active Problems:   SVD (spontaneous vaginal delivery)   Active labor  Additional problems: none     Discharge diagnosis: Term Pregnancy Delivered                                                                                                Post partum procedures:none  Augmentation: none  Complications: None  Hospital course:  Onset of Labor With Vaginal Delivery     30 y.o. yo G3P3003 at 110w1d was admitted in Active Labor on 05/15/2016. Patient had an uncomplicated labor course as follows:  Membrane Rupture Time/Date: 9:30 AM ,05/15/2016   Intrapartum Procedures: Episiotomy: None [1]                                         Lacerations:  None [1]  Patient had a delivery of a Viable infant. 05/15/2016  Information for the patient's newborn:  Shonta, Bourque [[id number]]  Delivery Method: Vaginal, Spontaneous Delivery (Filed from Delivery Summary)    Pateint had an uncomplicated postpartum course.  She is ambulating, tolerating a regular diet, passing flatus, and urinating well. Patient is discharged home in stable condition on 05/17/16.    Physical exam Vitals:   05/15/16 1946 05/16/16 0500 05/16/16 1825 05/17/16 0512  BP: 117/73 106/68 109/66 119/64  Pulse: 70 63 66 63  Resp: 20 18 18 18   Temp: 99.1 F (37.3 C) 98 F (36.7 C) 98.1 F (36.7 C) 98.2 F (36.8 C)  TempSrc: Oral Oral Oral   Weight:      Height:       General: alert, cooperative and no distress Lochia: appropriate Uterine Fundus: firm Incision: N/A DVT Evaluation: No evidence of DVT seen on physical exam. Negative Homan's sign. No cords or calf tenderness. No significant calf/ankle edema. Labs: Lab  Results  Component Value Date   WBC 13.5 (H) 05/15/2016   HGB 11.7 (L) 05/15/2016   HCT 33.4 (L) 05/15/2016   MCV 82.5 05/15/2016   PLT 261 05/15/2016   CMP Latest Ref Rng & Units 10/25/2012  Glucose 70 - 99 mg/dL 88  BUN 6 - 23 mg/dL 9  Creatinine 7.82 - 9.56 mg/dL 2.13  Sodium 086 - 578 mEq/L 137  Potassium 3.5 - 5.1 mEq/L 3.9  Chloride 96 - 112 mEq/L 102  CO2 19 - 32 mEq/L 20  Calcium 8.4 - 10.5 mg/dL 9.1  Total Protein 6.0 - 8.3 g/dL 7.1  Total Bilirubin 0.3 - 1.2 mg/dL 0.4  Alkaline Phos 39 - 117 U/L 228(H)  AST 0 - 37 U/L 34  ALT 0 - 35 U/L 40(H)    Discharge instruction: per After Visit Summary and "Baby and Me Booklet".  After visit meds:    Medication List  TAKE these medications   ibuprofen 600 MG tablet Commonly known as:  ADVIL,MOTRIN Take 1 tablet (600 mg total) by mouth every 6 (six) hours.   prenatal multivitamin Tabs tablet Take 1 tablet by mouth at bedtime.       Diet: routine diet  Activity: Advance as tolerated. Pelvic rest for 6 weeks.   Outpatient follow up:6 weeks Follow up Appt:No future appointments. Follow up Visit:No Follow-up on file.  Postpartum contraception: Condoms  Newborn Data: Live born female  Birth Weight: 7 lb 0.2 oz (3181 g) APGAR: 8, 8  Baby Feeding: Breast Disposition:home with mother   05/17/2016 Frederik PearJulie P Degele, MD  OB FELLOW DISCHARGE ATTESTATION  I have seen and examined this patient and agree with above documentation in the resident's note.   Ernestina PennaNicholas Schenk, MD 7:51 AM

## 2016-05-17 NOTE — Discharge Instructions (Signed)
Lactancia materna (Breastfeeding) Decidir amamantar es una de las mejores elecciones que puede hacer por usted y su beb. El cambio hormonal durante el embarazo produce el desarrollo del tejido mamario y aumenta la cantidad y el tamao de los conductos galactforos. Estas hormonas tambin permiten que las protenas, los azcares y las grasas de la sangre produzcan la leche materna en las glndulas productoras de leche. Las hormonas impiden que la leche materna sea liberada antes del nacimiento del beb, adems de impulsar el flujo de leche luego del nacimiento. Una vez que ha comenzado a amamantar, pensar en el beb, as como la succin o el llanto, pueden estimular la liberacin de leche de las glndulas productoras de leche.  LOS BENEFICIOS DE AMAMANTAR Para el beb  La primera leche (calostro) ayuda a mejorar el funcionamiento del sistema digestivo del beb.  La leche tiene anticuerpos que ayudan a prevenir las infecciones en el beb.  El beb tiene una menor incidencia de asma, alergias y del sndrome de muerte sbita del lactante.  Los nutrientes en la leche materna son mejores para el beb que la leche maternizada y estn preparados exclusivamente para cubrir las necesidades del beb.  La leche materna mejora el desarrollo cerebral del beb.  Es menos probable que el beb desarrolle otras enfermedades, como obesidad infantil, asma o diabetes mellitus de tipo 2. Para usted   La lactancia materna favorece el desarrollo de un vnculo muy especial entre la madre y el beb.  Es conveniente. La leche materna siempre est disponible a la temperatura correcta y es econmica.  La lactancia materna ayuda a quemar caloras y a perder el peso ganado durante el embarazo.  Favorece la contraccin del tero al tamao que tena antes del embarazo de manera ms rpida y disminuye el sangrado (loquios) despus del parto.  La lactancia materna contribuye a reducir el riesgo de desarrollar diabetes  mellitus de tipo 2, osteoporosis o cncer de mama o de ovario en el futuro. SIGNOS DE QUE EL BEB EST HAMBRIENTO Primeros signos de hambre  Aumenta su estado de alerta o actividad.  Se estira.  Mueve la cabeza de un lado a otro.  Mueve la cabeza y abre la boca cuando se le toca la mejilla o la comisura de la boca (reflejo de bsqueda).  Aumenta las vocalizaciones, tales como sonidos de succin, se relame los labios, emite arrullos, suspiros, o chirridos.  Mueve la mano hacia la boca.  Se chupa con ganas los dedos o las manos. Signos tardos de hambre  Est agitado.  Llora de manera intermitente. Signos de hambre extrema Los signos de hambre extrema requerirn que lo calme y lo consuele antes de que el beb pueda alimentarse adecuadamente. No espere a que se manifiesten los siguientes signos de hambre extrema para comenzar a amamantar:   Agitacin.  Llanto intenso y fuerte.  Gritos. INFORMACIN BSICA SOBRE LA LACTANCIA MATERNA Iniciacin de la lactancia materna  Encuentre un lugar cmodo para sentarse o acostarse, con un buen respaldo para el cuello y la espalda.  Coloque una almohada o una manta enrollada debajo del beb para acomodarlo a la altura de la mama (si est sentada). Las almohadas para amamantar se han diseado especialmente a fin de servir de apoyo para los brazos y el beb mientras amamanta.  Asegrese de que el abdomen del beb est frente al suyo.   Masajee suavemente la mama. Con las yemas de los dedos, masajee la pared del pecho hacia el pezn en un movimiento circular.   Esto estimula el flujo de leche. Es posible que deba continuar este movimiento mientras amamanta si la leche fluye lentamente.  Sostenga la mama con el pulgar por arriba del pezn y los otros 4 dedos por debajo de la mama. Asegrese de que los dedos se encuentren lejos del pezn y de la boca del beb.  Empuje suavemente los labios del beb con el pezn o con el dedo.  Cuando la boca del  beb se abra lo suficiente, acrquelo rpidamente a la mama e introduzca todo el pezn y la zona oscura que lo rodea (areola), tanto como sea posible, dentro de la boca del beb.  Debe haber ms areola visible por arriba del labio superior del beb que por debajo del labio inferior.  La lengua del beb debe estar entre la enca inferior y la mama.  Asegrese de que la boca del beb est en la posicin correcta alrededor del pezn (prendida). Los labios del beb deben crear un sello sobre la mama y estar doblados hacia afuera (invertidos).  Es comn que el beb succione durante 2 a 3 minutos para que comience el flujo de leche materna. Cmo debe prenderse Es muy importante que le ensee al beb cmo prenderse adecuadamente a la mama. Si el beb no se prende adecuadamente, puede causarle dolor en el pezn y reducir la produccin de leche materna, y hacer que el beb tenga un escaso aumento de peso. Adems, si el beb no se prende adecuadamente al pezn, puede tragar aire durante la alimentacin. Esto puede causarle molestias al beb. Hacer eructar al beb al cambiar de mama puede ayudarlo a liberar el aire. Sin embargo, ensearle al beb cmo prenderse a la mama adecuadamente es la mejor manera de evitar que se sienta molesto por tragar aire mientras se alimenta. Signos de que el beb se ha prendido adecuadamente al pezn:   Tironea o succiona de modo silencioso, sin causarle dolor.  Se escucha que traga cada 3 o 4 succiones.  Hay movimientos musculares por arriba y por delante de sus odos al succionar. Signos de que el beb no se ha prendido adecuadamente al pezn:   Hace ruidos de succin o de chasquido mientras se alimenta.  Siente dolor en el pezn. Si cree que el beb no se prendi correctamente, deslice el dedo en la comisura de la boca y colquelo entre las encas del beb para interrumpir la succin. Intente comenzar a amamantar nuevamente. Signos de lactancia materna exitosa Signos  del beb:   Disminuye gradualmente el nmero de succiones o cesa la succin por completo.  Se duerme.  Relaja el cuerpo.  Retiene una pequea cantidad de leche en la boca.  Se desprende solo del pecho. Signos que presenta usted:  Las mamas han aumentado la firmeza, el peso y el tamao 1 a 3 horas despus de amamantar.  Estn ms blandas inmediatamente despus de amamantar.  Un aumento del volumen de leche, y tambin un cambio en su consistencia y color se producen hacia el quinto da de lactancia materna.  Los pezones no duelen, ni estn agrietados ni sangran. Signos de que su beb recibe la cantidad de leche suficiente  Moja al menos 3 paales en 24 horas. La orina debe ser clara y de color amarillo plido a los 5 das de vida.  Defeca al menos 3 veces en 24 horas a los 5 das de vida. La materia fecal debe ser blanda y amarillenta.  Defeca al menos 3 veces en 24 horas a los   7 das de vida. La materia fecal debe ser grumosa y amarillenta.  No registra una prdida de peso mayor del 10% del peso al nacer durante los primeros 3 das de vida.  Aumenta de peso un promedio de 4 a 7onzas (113 a 198g) por semana despus de los 4 das de vida.  Aumenta de peso, diariamente, de manera uniforme a partir de los 5 das de vida, sin registrar prdida de peso despus de las 2semanas de vida. Despus de alimentarse, es posible que el beb regurgite una pequea cantidad. Esto es frecuente. FRECUENCIA Y DURACIN DE LA LACTANCIA MATERNA El amamantamiento frecuente la ayudar a producir ms leche y a prevenir problemas de dolor en los pezones e hinchazn en las mamas. Alimente al beb cuando muestre signos de hambre o si siente la necesidad de reducir la congestin de las mamas. Esto se denomina "lactancia a demanda". Evite el uso del chupete mientras trabaja para establecer la lactancia (las primeras 4 a 6 semanas despus del nacimiento del beb). Despus de este perodo, podr ofrecerle un  chupete. Las investigaciones demostraron que el uso del chupete durante el primer ao de vida del beb disminuye el riesgo de desarrollar el sndrome de muerte sbita del lactante (SMSL). Permita que el nio se alimente en cada mama todo lo que desee. Contine amamantando al beb hasta que haya terminado de alimentarse. Cuando el beb se desprende o se queda dormido mientras se est alimentando de la primera mama, ofrzcale la segunda. Debido a que, con frecuencia, los recin nacidos permanecen somnolientos las primeras semanas de vida, es posible que deba despertar al beb para alimentarlo. Los horarios de lactancia varan de un beb a otro. Sin embargo, las siguientes reglas pueden servir como gua para ayudarla a garantizar que el beb se alimenta adecuadamente:  Se puede amamantar a los recin nacidos (bebs de 4 semanas o menos de vida) cada 1 a 3 horas.  No deben transcurrir ms de 3 horas durante el da o 5 horas durante la noche sin que se amamante a los recin nacidos.  Debe amamantar al beb 8 veces como mnimo en un perodo de 24 horas, hasta que comience a introducir slidos en su dieta, a los 6 meses de vida aproximadamente. EXTRACCIN DE LECHE MATERNA La extraccin y el almacenamiento de la leche materna le permiten asegurarse de que el beb se alimente exclusivamente de leche materna, aun en momentos en los que no puede amamantar. Esto tiene especial importancia si debe regresar al trabajo en el perodo en que an est amamantando o si no puede estar presente en los momentos en que el beb debe alimentarse. Su asesor en lactancia puede orientarla sobre cunto tiempo es seguro almacenar leche materna.  El sacaleche es un aparato que le permite extraer leche de la mama a un recipiente estril. Luego, la leche materna extrada puede almacenarse en un refrigerador o congelador. Algunos sacaleches son manuales, mientras que otros son elctricos. Consulte a su asesor en lactancia qu tipo ser  ms conveniente para usted. Los sacaleches se pueden comprar; sin embargo, algunos hospitales y grupos de apoyo a la lactancia materna alquilan sacaleches mensualmente. Un asesor en lactancia puede ensearle cmo extraer leche materna manualmente, en caso de que prefiera no usar un sacaleche.  CMO CUIDAR LAS MAMAS DURANTE LA LACTANCIA MATERNA Los pezones se secan, agrietan y duelen durante la lactancia materna. Las siguientes recomendaciones pueden ayudarla a mantener las mamas humectadas y sanas:  Evite usar jabn en los pezones.    Use un sostn de soporte. Aunque no son esenciales, las camisetas sin mangas o los sostenes especiales para amamantar estn diseados para acceder fcilmente a las mamas, para amamantar sin tener que quitarse todo el sostn o la camiseta. Evite usar sostenes con aro o sostenes muy ajustados.  Seque al aire sus pezones durante 3 a 4minutos despus de amamantar al beb.  Utilice solo apsitos de algodn en el sostn para absorber las prdidas de leche. La prdida de un poco de leche materna entre las tomas es normal.  Utilice lanolina sobre los pezones luego de amamantar. La lanolina ayuda a mantener la humedad normal de la piel. Si usa lanolina pura, no tiene que lavarse los pezones antes de volver a alimentar al beb. La lanolina pura no es txica para el beb. Adems, puede extraer manualmente algunas gotas de leche materna y masajear suavemente esa leche sobre los pezones, para que la leche se seque al aire. Durante las primeras semanas despus de dar a luz, algunas mujeres pueden experimentar hinchazn en las mamas (congestin mamaria). La congestin puede hacer que sienta las mamas pesadas, calientes y sensibles al tacto. El pico de la congestin ocurre dentro de los 3 a 5 das despus del parto. Las siguientes recomendaciones pueden ayudarla a aliviar la congestin:  Vace por completo las mamas al amamantar o extraer leche. Puede aplicar calor hmedo en las mamas  (en la ducha o con toallas hmedas para manos) antes de amamantar o extraer leche. Esto aumenta la circulacin y ayuda a que la leche fluya. Si el beb no vaca por completo las mamas cuando lo amamanta, extraiga la leche restante despus de que haya finalizado.  Use un sostn ajustado (para amamantar o comn) o una camiseta sin mangas durante 1 o 2 das para indicar al cuerpo que disminuya ligeramente la produccin de leche.  Aplique compresas de hielo sobre las mamas, a menos que le resulte demasiado incmodo.  Asegrese de que el beb est prendido y se encuentre en la posicin correcta mientras lo alimenta. Si la congestin persiste luego de 48 horas o despus de seguir estas recomendaciones, comunquese con su mdico o un asesor en lactancia. RECOMENDACIONES GENERALES PARA EL CUIDADO DE LA SALUD DURANTE LA LACTANCIA MATERNA  Consuma alimentos saludables. Alterne comidas y colaciones, y coma 3 de cada una por da. Dado que lo que come afecta la leche materna, es posible que algunas comidas hagan que su beb se vuelva ms irritable de lo habitual. Evite comer este tipo de alimentos si percibe que afectan de manera negativa al beb.  Beba leche, jugos de fruta y agua para satisfacer su sed (aproximadamente 10 vasos al da).  Descanse con frecuencia, reljese y tome sus vitaminas prenatales para evitar la fatiga, el estrs y la anemia.  Contine con los autocontroles de la mama.  Evite masticar y fumar tabaco. Las sustancias qumicas de los cigarrillos que pasan a la leche materna y la exposicin al humo ambiental del tabaco pueden daar al beb.  No consuma alcohol ni drogas, incluida la marihuana. Algunos medicamentos, que pueden ser perjudiciales para el beb, pueden pasar a travs de la leche materna. Es importante que consulte a su mdico antes de tomar cualquier medicamento, incluidos todos los medicamentos recetados y de venta libre, as como los suplementos vitamnicos y  herbales. Puede quedar embarazada durante la lactancia. Si desea controlar la natalidad, consulte a su mdico cules son las opciones ms seguras para el beb. SOLICITE ATENCIN MDICA SI:   Usted   siente que quiere dejar de amamantar o se siente frustrada con la lactancia.  Siente dolor en las mamas o en los pezones.  Sus pezones estn agrietados o sangran.  Sus pechos estn irritados, sensibles o calientes.  Tiene un rea hinchada en cualquiera de las mamas.  Siente escalofros o fiebre.  Tiene nuseas o vmitos.  Presenta una secrecin de otro lquido distinto de la leche materna de los pezones.  Sus mamas no se llenan antes de amamantar al beb para el quinto da despus del parto.  Se siente triste y deprimida.  El beb est demasiado somnoliento como para comer bien.  El beb tiene problemas para dormir.  Moja menos de 3 paales en 24 horas.  Defeca menos de 3 veces en 24 horas.  La piel del beb o la parte blanca de los ojos se vuelven amarillentas.  El beb no ha aumentado de peso a los 5 das de vida. SOLICITE ATENCIN MDICA DE INMEDIATO SI:   El beb est muy cansado (letargo) y no se quiere despertar para comer.  Le sube la fiebre sin causa.   Esta informacin no tiene como fin reemplazar el consejo del mdico. Asegrese de hacerle al mdico cualquier pregunta que tenga.   Document Released: 08/21/2005 Document Revised: 05/12/2015 Elsevier Interactive Patient Education 2016 [employer]  

## 2017-03-29 ENCOUNTER — Encounter: Payer: Self-pay | Admitting: Emergency Medicine

## 2017-03-29 ENCOUNTER — Emergency Department (HOSPITAL_COMMUNITY): Payer: Medicaid Other | Admitting: Anesthesiology

## 2017-03-29 ENCOUNTER — Observation Stay (HOSPITAL_COMMUNITY)
Admission: EM | Admit: 2017-03-29 | Discharge: 2017-03-30 | Disposition: A | Discharge status: Home / Self Care | Payer: Medicaid Other | Attending: Surgery | Admitting: Surgery

## 2017-03-29 ENCOUNTER — Encounter (HOSPITAL_COMMUNITY)
Admission: EM | Disposition: A | Discharge status: Home / Self Care | Payer: Self-pay | Source: Home / Self Care | Attending: Emergency Medicine

## 2017-03-29 ENCOUNTER — Emergency Department (HOSPITAL_COMMUNITY): Payer: Medicaid Other

## 2017-03-29 DIAGNOSIS — K810 Acute cholecystitis: Secondary | ICD-10-CM

## 2017-03-29 DIAGNOSIS — K812 Acute cholecystitis with chronic cholecystitis: Principal | ICD-10-CM | POA: Insufficient documentation

## 2017-03-29 DIAGNOSIS — K8000 Calculus of gallbladder with acute cholecystitis without obstruction: Secondary | ICD-10-CM | POA: Diagnosis present

## 2017-03-29 DIAGNOSIS — K819 Cholecystitis, unspecified: Secondary | ICD-10-CM | POA: Diagnosis present

## 2017-03-29 DIAGNOSIS — Z79899 Other long term (current) drug therapy: Secondary | ICD-10-CM | POA: Insufficient documentation

## 2017-03-29 DIAGNOSIS — R52 Pain, unspecified: Secondary | ICD-10-CM

## 2017-03-29 HISTORY — PX: CHOLECYSTECTOMY: SHX55

## 2017-03-29 HISTORY — PX: LAPAROSCOPIC CHOLECYSTECTOMY: SUR755

## 2017-03-29 LAB — URINALYSIS, ROUTINE W REFLEX MICROSCOPIC
Bilirubin Urine: NEGATIVE
GLUCOSE, UA: NEGATIVE mg/dL
HGB URINE DIPSTICK: NEGATIVE
Ketones, ur: NEGATIVE mg/dL
NITRITE: NEGATIVE
Protein, ur: >=300 mg/dL — AB
SPECIFIC GRAVITY, URINE: 1.026 (ref 1.005–1.030)
pH: 5 (ref 5.0–8.0)

## 2017-03-29 LAB — COMPREHENSIVE METABOLIC PANEL
ALBUMIN: 4.5 g/dL (ref 3.5–5.0)
ALK PHOS: 94 U/L (ref 38–126)
ALT: 81 U/L — ABNORMAL HIGH (ref 14–54)
ANION GAP: 10 (ref 5–15)
AST: 38 U/L (ref 15–41)
BUN: 9 mg/dL (ref 6–20)
CALCIUM: 9.1 mg/dL (ref 8.9–10.3)
CHLORIDE: 105 mmol/L (ref 101–111)
CO2: 22 mmol/L (ref 22–32)
Creatinine, Ser: 0.45 mg/dL (ref 0.44–1.00)
GFR calc non Af Amer: >60 mL/min (ref >60)
GLUCOSE: 117 mg/dL — AB (ref 65–99)
POTASSIUM: 4 mmol/L (ref 3.5–5.1)
SODIUM: 137 mmol/L (ref 135–145)
Total Bilirubin: 1.7 mg/dL — ABNORMAL HIGH (ref 0.3–1.2)
Total Protein: 8.3 g/dL — ABNORMAL HIGH (ref 6.5–8.1)

## 2017-03-29 LAB — CBC
HCT: 36.7 % (ref 36.0–46.0)
HEMOGLOBIN: 12.4 g/dL (ref 12.0–15.0)
MCH: 28.1 pg (ref 26.0–34.0)
MCHC: 33.8 g/dL (ref 30.0–36.0)
MCV: 83.2 fL (ref 78.0–100.0)
Platelets: 350 10*3/uL (ref 150–400)
RBC: 4.41 MIL/uL (ref 3.87–5.11)
RDW: 12.6 % (ref 11.5–15.5)
WBC: 13.3 10*3/uL — AB (ref 4.0–10.5)

## 2017-03-29 LAB — LIPASE, BLOOD: LIPASE: 24 U/L (ref 11–51)

## 2017-03-29 LAB — POC URINE PREG, ED: Preg Test, Ur: NEGATIVE

## 2017-03-29 SURGERY — LAPAROSCOPIC CHOLECYSTECTOMY
Anesthesia: General | Site: Abdomen

## 2017-03-29 MED ORDER — SUGAMMADEX SODIUM 500 MG/5ML IV SOLN
INTRAVENOUS | Status: AC
Start: 2017-03-29 — End: 2017-03-29
  Filled 2017-03-29: qty 5

## 2017-03-29 MED ORDER — OXYCODONE HCL 5 MG/5ML PO SOLN: 5.0000 mg | Freq: Once | ORAL | Status: DC | PRN

## 2017-03-29 MED ORDER — DIPHENHYDRAMINE HCL 50 MG/ML IJ SOLN: 25.0000 mg | Freq: Four times a day (QID) | INTRAMUSCULAR | Status: DC | PRN

## 2017-03-29 MED ORDER — DOCUSATE SODIUM 100 MG PO CAPS
100.0000 mg | ORAL_CAPSULE | Freq: Two times a day (BID) | ORAL | Status: DC
  Administered 2017-03-29 – 2017-03-30 (×2): 100 mg via ORAL
  Filled 2017-03-29 (×2): qty 1

## 2017-03-29 MED ORDER — ONDANSETRON HCL 4 MG/2ML IJ SOLN
4.0000 mg | Freq: Once | INTRAMUSCULAR | Status: AC
  Administered 2017-03-29: 4 mg via INTRAVENOUS
  Filled 2017-03-29: qty 2

## 2017-03-29 MED ORDER — LIDOCAINE HCL (CARDIAC) 20 MG/ML IV SOLN
INTRAVENOUS | Status: DC | PRN
  Administered 2017-03-29: 80 mg via INTRAVENOUS

## 2017-03-29 MED ORDER — OXYCODONE HCL 5 MG PO TABS: 5.0000 mg | ORAL_TABLET | Freq: Once | ORAL | Status: DC | PRN

## 2017-03-29 MED ORDER — FENTANYL CITRATE (PF) 100 MCG/2ML IJ SOLN
INTRAMUSCULAR | Status: DC | PRN
  Administered 2017-03-29: 50 ug via INTRAVENOUS
  Administered 2017-03-29 (×3): 100 ug via INTRAVENOUS

## 2017-03-29 MED ORDER — ACETAMINOPHEN 325 MG PO TABS
650.0000 mg | ORAL_TABLET | Freq: Four times a day (QID) | ORAL | Status: DC | PRN
  Administered 2017-03-30: 650 mg via ORAL
  Filled 2017-03-29: qty 2

## 2017-03-29 MED ORDER — KETOROLAC TROMETHAMINE 30 MG/ML IJ SOLN
30.0000 mg | Freq: Once | INTRAMUSCULAR | Status: DC | PRN
  Administered 2017-03-29: 30 mg via INTRAVENOUS

## 2017-03-29 MED ORDER — KETOROLAC TROMETHAMINE 30 MG/ML IJ SOLN
INTRAMUSCULAR | Status: AC
  Filled 2017-03-29: qty 1

## 2017-03-29 MED ORDER — FENTANYL CITRATE (PF) 100 MCG/2ML IJ SOLN: 25.0000 ug | INTRAMUSCULAR | Status: DC | PRN

## 2017-03-29 MED ORDER — HYDROMORPHONE HCL 1 MG/ML IJ SOLN
0.5000 mg | INTRAMUSCULAR | Status: DC | PRN
  Administered 2017-03-29 – 2017-03-30 (×2): 0.5 mg via INTRAVENOUS
  Filled 2017-03-29 (×2): qty 1

## 2017-03-29 MED ORDER — ONDANSETRON 4 MG PO TBDP: 4.0000 mg | ORAL_TABLET | Freq: Four times a day (QID) | ORAL | Status: DC | PRN

## 2017-03-29 MED ORDER — DEXTROSE 5 % IV SOLN
2.0000 g | INTRAVENOUS | Status: DC
  Filled 2017-03-29: qty 2

## 2017-03-29 MED ORDER — IBUPROFEN 600 MG PO TABS: 600.0000 mg | ORAL_TABLET | Freq: Four times a day (QID) | ORAL | Status: DC

## 2017-03-29 MED ORDER — SUGAMMADEX SODIUM 200 MG/2ML IV SOLN
INTRAVENOUS | Status: DC | PRN
  Administered 2017-03-29: 300 mg via INTRAVENOUS

## 2017-03-29 MED ORDER — DIPHENHYDRAMINE HCL 25 MG PO CAPS: 25.0000 mg | ORAL_CAPSULE | Freq: Four times a day (QID) | ORAL | Status: DC | PRN

## 2017-03-29 MED ORDER — MEPERIDINE HCL 25 MG/ML IJ SOLN: 6.2500 mg | INTRAMUSCULAR | Status: DC | PRN

## 2017-03-29 MED ORDER — SODIUM CHLORIDE 0.9 % IR SOLN
Status: DC | PRN
  Administered 2017-03-29: 1000 mL

## 2017-03-29 MED ORDER — FENTANYL CITRATE (PF) 250 MCG/5ML IJ SOLN
INTRAMUSCULAR | Status: AC
  Filled 2017-03-29: qty 5

## 2017-03-29 MED ORDER — SODIUM CHLORIDE 0.9 % IV SOLN
INTRAVENOUS | Status: DC
  Administered 2017-03-29 – 2017-03-30 (×2): via INTRAVENOUS

## 2017-03-29 MED ORDER — ENOXAPARIN SODIUM 40 MG/0.4ML ~~LOC~~ SOLN
40.0000 mg | SUBCUTANEOUS | Status: DC
  Administered 2017-03-30: 40 mg via SUBCUTANEOUS
  Filled 2017-03-29: qty 0.4

## 2017-03-29 MED ORDER — CEFAZOLIN SODIUM-DEXTROSE 2-3 GM-% IV SOLR
INTRAVENOUS | Status: DC | PRN
  Administered 2017-03-29: 2 g via INTRAVENOUS

## 2017-03-29 MED ORDER — PHENYLEPHRINE 40 MCG/ML (10ML) SYRINGE FOR IV PUSH (FOR BLOOD PRESSURE SUPPORT)
PREFILLED_SYRINGE | INTRAVENOUS | Status: AC
Start: 2017-03-29 — End: 2017-03-29
  Filled 2017-03-29: qty 10

## 2017-03-29 MED ORDER — SODIUM CHLORIDE 0.9 % IV BOLUS (SEPSIS)
1000.0000 mL | Freq: Once | INTRAVENOUS | Status: AC
  Administered 2017-03-29: 1000 mL via INTRAVENOUS

## 2017-03-29 MED ORDER — LACTATED RINGERS IV SOLN
INTRAVENOUS | Status: DC
  Administered 2017-03-29 (×3): via INTRAVENOUS

## 2017-03-29 MED ORDER — IBUPROFEN 600 MG PO TABS
600.0000 mg | ORAL_TABLET | Freq: Four times a day (QID) | ORAL | Status: DC | PRN
  Administered 2017-03-30 (×2): 600 mg via ORAL
  Filled 2017-03-29 (×2): qty 1

## 2017-03-29 MED ORDER — MIDAZOLAM HCL 5 MG/5ML IJ SOLN
INTRAMUSCULAR | Status: DC | PRN
Start: 2017-03-29 — End: 2017-03-29
  Administered 2017-03-29: 2 mg via INTRAVENOUS

## 2017-03-29 MED ORDER — TRAMADOL HCL 50 MG PO TABS
50.0000 mg | ORAL_TABLET | Freq: Four times a day (QID) | ORAL | Status: DC | PRN
  Administered 2017-03-29: 50 mg via ORAL
  Filled 2017-03-29: qty 1

## 2017-03-29 MED ORDER — ONDANSETRON HCL 4 MG/2ML IJ SOLN
INTRAMUSCULAR | Status: DC | PRN
  Administered 2017-03-29: 4 mg via INTRAVENOUS

## 2017-03-29 MED ORDER — PHENYLEPHRINE HCL 10 MG/ML IJ SOLN
INTRAMUSCULAR | Status: DC | PRN
  Administered 2017-03-29: 80 ug via INTRAVENOUS

## 2017-03-29 MED ORDER — DEXAMETHASONE SODIUM PHOSPHATE 10 MG/ML IJ SOLN
INTRAMUSCULAR | Status: AC
  Filled 2017-03-29: qty 1

## 2017-03-29 MED ORDER — ROCURONIUM BROMIDE 10 MG/ML (PF) SYRINGE
PREFILLED_SYRINGE | INTRAVENOUS | Status: AC
  Filled 2017-03-29: qty 5

## 2017-03-29 MED ORDER — ACETAMINOPHEN 160 MG/5ML PO SOLN: 325.0000 mg | ORAL | Status: DC | PRN

## 2017-03-29 MED ORDER — DEXAMETHASONE SODIUM PHOSPHATE 4 MG/ML IJ SOLN
INTRAMUSCULAR | Status: DC | PRN
  Administered 2017-03-29: 10 mg via INTRAVENOUS

## 2017-03-29 MED ORDER — ACETAMINOPHEN 650 MG RE SUPP: 650.0000 mg | Freq: Four times a day (QID) | RECTAL | Status: DC | PRN

## 2017-03-29 MED ORDER — LIDOCAINE 2% (20 MG/ML) 5 ML SYRINGE
INTRAMUSCULAR | Status: AC
  Filled 2017-03-29: qty 5

## 2017-03-29 MED ORDER — 0.9 % SODIUM CHLORIDE (POUR BTL) OPTIME
TOPICAL | Status: DC | PRN
  Administered 2017-03-29: 1000 mL

## 2017-03-29 MED ORDER — ONDANSETRON HCL 4 MG/2ML IJ SOLN
INTRAMUSCULAR | Status: AC
  Filled 2017-03-29: qty 2

## 2017-03-29 MED ORDER — IOPAMIDOL (ISOVUE-300) INJECTION 61%
INTRAVENOUS | Status: AC
  Filled 2017-03-29: qty 50

## 2017-03-29 MED ORDER — PROPOFOL 10 MG/ML IV BOLUS
INTRAVENOUS | Status: DC | PRN
  Administered 2017-03-29: 200 mg via INTRAVENOUS

## 2017-03-29 MED ORDER — MORPHINE SULFATE (PF) 4 MG/ML IV SOLN
4.0000 mg | Freq: Once | INTRAVENOUS | Status: AC
  Administered 2017-03-29: 4 mg via INTRAVENOUS
  Filled 2017-03-29: qty 1

## 2017-03-29 MED ORDER — CEFAZOLIN SODIUM 1 G IJ SOLR
INTRAMUSCULAR | Status: AC
  Filled 2017-03-29: qty 20

## 2017-03-29 MED ORDER — DEXTROSE 5 % IV SOLN
2.0000 g | Freq: Once | INTRAVENOUS | Status: AC
  Administered 2017-03-29: 2 g via INTRAVENOUS
  Filled 2017-03-29: qty 2

## 2017-03-29 MED ORDER — BUPIVACAINE-EPINEPHRINE (PF) 0.25% -1:200000 IJ SOLN
INTRAMUSCULAR | Status: AC
  Filled 2017-03-29: qty 30

## 2017-03-29 MED ORDER — BUPIVACAINE-EPINEPHRINE (PF) 0.25% -1:200000 IJ SOLN
INTRAMUSCULAR | Status: DC | PRN
  Administered 2017-03-29: 20 mL via PERINEURAL

## 2017-03-29 MED ORDER — ONDANSETRON HCL 4 MG/2ML IJ SOLN
4.0000 mg | Freq: Four times a day (QID) | INTRAMUSCULAR | Status: DC | PRN
  Administered 2017-03-30: 4 mg via INTRAVENOUS
  Filled 2017-03-29: qty 2

## 2017-03-29 MED ORDER — ONDANSETRON HCL 4 MG/2ML IJ SOLN: 4.0000 mg | Freq: Once | INTRAMUSCULAR | Status: DC | PRN

## 2017-03-29 MED ORDER — PRENATAL MULTIVITAMIN CH
1.0000 | ORAL_TABLET | Freq: Every day | ORAL | Status: DC
  Administered 2017-03-29: 1 via ORAL
  Filled 2017-03-29: qty 1

## 2017-03-29 MED ORDER — ACETAMINOPHEN 325 MG PO TABS: 325.0000 mg | ORAL_TABLET | ORAL | Status: DC | PRN

## 2017-03-29 MED ORDER — ROCURONIUM BROMIDE 100 MG/10ML IV SOLN
INTRAVENOUS | Status: DC | PRN
  Administered 2017-03-29: 60 mg via INTRAVENOUS

## 2017-03-29 MED ORDER — PROPOFOL 10 MG/ML IV BOLUS
INTRAVENOUS | Status: AC
  Filled 2017-03-29: qty 20

## 2017-03-29 MED ORDER — MIDAZOLAM HCL 2 MG/2ML IJ SOLN
INTRAMUSCULAR | Status: AC
  Filled 2017-03-29: qty 2

## 2017-03-29 SURGICAL SUPPLY — 37 items
APPLIER CLIP 5 13 M/L LIGAMAX5 (MISCELLANEOUS) ×4
CANISTER SUCT 3000ML PPV (MISCELLANEOUS) ×4 IMPLANT
CHLORAPREP W/TINT 26ML (MISCELLANEOUS) ×4 IMPLANT
CLIP APPLIE 5 13 M/L LIGAMAX5 (MISCELLANEOUS) ×2 IMPLANT
COVER MAYO STAND STRL (DRAPES) ×4 IMPLANT
COVER SURGICAL LIGHT HANDLE (MISCELLANEOUS) ×4 IMPLANT
DERMABOND ADVANCED (GAUZE/BANDAGES/DRESSINGS) ×2
DERMABOND ADVANCED .7 DNX12 (GAUZE/BANDAGES/DRESSINGS) ×2 IMPLANT
DRAPE C-ARM 42X72 X-RAY (DRAPES) ×4 IMPLANT
ELECT REM PT RETURN 9FT ADLT (ELECTROSURGICAL) ×4
ELECTRODE REM PT RTRN 9FT ADLT (ELECTROSURGICAL) ×2 IMPLANT
GLOVE BIO SURGEON STRL SZ 6 (GLOVE) ×4 IMPLANT
GLOVE BIO SURGEON STRL SZ7.5 (GLOVE) ×4 IMPLANT
GLOVE BIOGEL PI IND STRL 6.5 (GLOVE) ×2 IMPLANT
GLOVE BIOGEL PI INDICATOR 6.5 (GLOVE) ×2
GLOVE INDICATOR 7.5 STRL GRN (GLOVE) ×4 IMPLANT
GLOVE SURG SS PI 6.5 STRL IVOR (GLOVE) ×8 IMPLANT
GOWN STRL REUS W/ TWL LRG LVL3 (GOWN DISPOSABLE) ×10 IMPLANT
GOWN STRL REUS W/TWL LRG LVL3 (GOWN DISPOSABLE) ×10
GRASPER SUT TROCAR 14GX15 (MISCELLANEOUS) ×4 IMPLANT
KIT BASIN OR (CUSTOM PROCEDURE TRAY) ×4 IMPLANT
KIT ROOM TURNOVER OR (KITS) ×4 IMPLANT
NS IRRIG 1000ML POUR BTL (IV SOLUTION) ×4 IMPLANT
PAD ARMBOARD 7.5X6 YLW CONV (MISCELLANEOUS) ×4 IMPLANT
POUCH SPECIMEN RETRIEVAL 10MM (ENDOMECHANICALS) ×4 IMPLANT
SCISSORS LAP 5X35 DISP (ENDOMECHANICALS) ×4 IMPLANT
SET CHOLANGIOGRAPH 5 50 .035 (SET/KITS/TRAYS/PACK) ×4 IMPLANT
SET IRRIG TUBING LAPAROSCOPIC (IRRIGATION / IRRIGATOR) ×4 IMPLANT
SLEEVE ENDOPATH XCEL 5M (ENDOMECHANICALS) ×4 IMPLANT
SPECIMEN JAR SMALL (MISCELLANEOUS) ×4 IMPLANT
SUT MNCRL AB 4-0 PS2 18 (SUTURE) ×4 IMPLANT
TOWEL OR 17X24 6PK STRL BLUE (TOWEL DISPOSABLE) ×4 IMPLANT
TOWEL OR 17X26 10 PK STRL BLUE (TOWEL DISPOSABLE) IMPLANT
TRAY LAPAROSCOPIC MC (CUSTOM PROCEDURE TRAY) ×4 IMPLANT
TROCAR XCEL NON-BLD 11X100MML (ENDOMECHANICALS) ×4 IMPLANT
TROCAR XCEL NON-BLD 5MMX100MML (ENDOMECHANICALS) ×4 IMPLANT
TUBING INSUFFLATION (TUBING) ×4 IMPLANT

## 2017-03-29 NOTE — Transfer of Care (Signed)
Immediate Anesthesia Transfer of Care Note  Patient: Haley Lin  Procedure(s) Performed: Procedure(s): LAPAROSCOPIC CHOLECYSTECTOMY (N/A)  Patient Location: PACU  Anesthesia Type:General  Level of Consciousness: awake, alert  and oriented  Airway & Oxygen Therapy: Patient Spontanous Breathing and Patient connected to nasal cannula oxygen  Post-op Assessment: Report given to RN, Post -op Vital signs reviewed and stable and Patient moving all extremities  Post vital signs: Reviewed and stable  Last Vitals:  Vitals:   03/29/17 1354 03/29/17 1724  BP: 102/76 105/66  Pulse: 86 (!) 118  Resp: 16 (!) 21  Temp:  37.4 C    Last Pain:  Vitals:   03/29/17 1724  TempSrc:   PainSc: 0-No pain         Complications: No apparent anesthesia complications

## 2017-03-29 NOTE — Anesthesia Postprocedure Evaluation (Signed)
Anesthesia Post Note  Patient: Haley Lin  Procedure(s) Performed: Procedure(s) (LRB): LAPAROSCOPIC CHOLECYSTECTOMY (N/A)     Patient location during evaluation: PACU Anesthesia Type: General Level of consciousness: awake and alert Pain management: pain level controlled Vital Signs Assessment: post-procedure vital signs reviewed and stable Respiratory status: spontaneous breathing, nonlabored ventilation and respiratory function stable Cardiovascular status: blood pressure returned to baseline and stable Postop Assessment: no signs of nausea or vomiting Anesthetic complications: no    Last Vitals:  Vitals:   03/29/17 1724 03/29/17 1745  BP: 105/66 106/69  Pulse: (!) 118 99  Resp: (!) 21 20  Temp: 37.4 C     Last Pain:  Vitals:   03/29/17 1745  TempSrc:   PainSc: 0-No pain                 Cecile HearingStephen Edward Nhung Danko

## 2017-03-29 NOTE — ED Provider Notes (Signed)
[facility]-EMERGENCY DEPT Provider Note   CSN: [id number] Arrival date & time: 03/29/17  [id number]     History   Chief Complaint Chief Complaint  Patient presents with  . Abdominal Pain    HPI Haley Lin is a 31 y.o. female.  Pt presents to the ED today with abdominal pain and n/v.  The pt did have a fever last night.  She did take ibuprofen.  This helped fever, but not the pain.  The pt has had this pain before, but no diagnosis was found.        Past Medical History:  Diagnosis Date  . Medical history non-contributory     Patient Active Problem List   Diagnosis Date Noted  . Active labor 05/15/2016  . SVD (spontaneous vaginal delivery) 10/27/2012    Past Surgical History:  Procedure Laterality Date  . NO PAST SURGERIES      OB History    Gravida Para Term Preterm AB Living   3 3 3  0 0 3   SAB TAB Ectopic Multiple Live Births   0 0 0 0 2       Home Medications    Prior to Admission medications   Medication Sig Start Date End Date Taking? Authorizing Provider  ibuprofen (ADVIL,MOTRIN) 600 MG tablet Take 1 tablet (600 mg total) by mouth every 6 (six) hours. 05/17/16  Yes Degele, Kandra NicolasJulie P, MD  Prenatal Vit-Fe Fumarate-FA (PRENATAL MULTIVITAMIN) TABS Take 1 tablet by mouth at bedtime.    Yes [provider]    Family History No family history on file.  Social History Social History  Substance Use Topics  . Smoking status: Never Smoker  . Smokeless tobacco: Never Used  . Alcohol use No     Allergies   Patient has no known allergies.   Review of Systems Review of Systems  Gastrointestinal: Positive for abdominal pain, nausea and vomiting.  All other systems reviewed and are negative.    Physical Exam Updated Vital Signs BP 110/66   Pulse (!) 101   Temp 99 F (37.2 C) (Oral)   Resp 20   SpO2 97%   Physical Exam  Constitutional: She is oriented to person, place, and time. She appears well-developed and well-nourished.  HENT:   Head: Normocephalic and atraumatic.  Right Ear: External ear normal.  Left Ear: External ear normal.  Nose: Nose normal.  Mouth/Throat: Oropharynx is clear and moist.  Eyes: Pupils are equal, round, and reactive to light. Conjunctivae and EOM are normal.  Neck: Normal range of motion. Neck supple.  Cardiovascular: Normal rate, regular rhythm, normal heart sounds and intact distal pulses.   Pulmonary/Chest: Effort normal and breath sounds normal.  Abdominal: Soft. Bowel sounds are normal. There is tenderness in the right upper quadrant.  Musculoskeletal: Normal range of motion.  Neurological: She is oriented to person, place, and time.  Skin: Skin is warm. Capillary refill takes less than 2 seconds.  Psychiatric: She has a normal mood and affect. Her behavior is normal. Judgment and thought content normal.  Nursing note and vitals reviewed.    ED Treatments / Results  Labs (all labs ordered are listed, but only abnormal results are displayed) Labs Reviewed  COMPREHENSIVE METABOLIC PANEL - Abnormal; Notable for the following:       Result Value   Glucose, Bld 117 (*)    Total Protein 8.3 (*)    ALT 81 (*)    Total Bilirubin 1.7 (*)    All other components within  normal limits  CBC - Abnormal; Notable for the following:    WBC 13.3 (*)    All other components within normal limits  URINALYSIS, ROUTINE W REFLEX MICROSCOPIC - Abnormal; Notable for the following:    Color, Urine AMBER (*)    APPearance HAZY (*)    Protein, ur >=300 (*)    Leukocytes, UA TRACE (*)    Bacteria, UA RARE (*)    Squamous Epithelial / LPF 0-5 (*)    All other components within normal limits  LIPASE, BLOOD  POC URINE PREG, ED    EKG  EKG Interpretation None       Radiology [place] Abdomen Limited Ruq  Result Date: 03/29/2017 CLINICAL DATA:  Right quadrant pain. EXAM: ULTRASOUND ABDOMEN LIMITED RIGHT UPPER QUADRANT COMPARISON:  Ultrasound 11/19/2015. FINDINGS: Gallbladder: Gallstones. Sludge.  Gallbladder wall thickening to 6.3 mm. Cholecystitis cannot be excluded. Common bile duct: Diameter: 5.4 mm Liver: Increased hepatic echogenicity consistent with fatty infiltration and/or hepatocellular disease. No focal hepatic abnormality identified. IMPRESSION: 1. Gallstones. Sludge. Gallbladder wall thickness 6.3 mm. Cholecystitis cannot be excluded . 2. Increased hepatic echogenicity consistent fatty infiltration and/or hepatocellular disease. Electronically Signed   By: Maisie Fushomas  Register   On: 03/29/2017 10:49    Procedures Procedures (including critical care time)  Medications Ordered in ED Medications  cefTRIAXone (ROCEPHIN) 2 g in dextrose 5 % 50 mL IVPB (not administered)  sodium chloride 0.9 % bolus 1,000 mL (0 mLs Intravenous Stopped 03/29/17 1210)  morphine 4 MG/ML injection 4 mg (4 mg Intravenous Given 03/29/17 1111)  ondansetron (ZOFRAN) injection 4 mg (4 mg Intravenous Given 03/29/17 1111)  cefTRIAXone (ROCEPHIN) 2 g in dextrose 5 % 50 mL IVPB (0 g Intravenous Stopped 03/29/17 1245)     Initial Impression / Assessment and Plan / ED Course  I have reviewed the triage vital signs and the nursing notes.  Pertinent labs & imaging results that were available during my care of the patient were reviewed by me and considered in my medical decision making (see chart for details).    Pain has improved.  HR better with IVFs.  Evidence of cholecystitis on GB us, so pt was d/w surgery.  Surgery will admit.  Final Clinical Impressions(s) / ED Diagnoses   Final diagnoses:  Pain  Acute cholecystitis    New Prescriptions New Prescriptions   No medications on file     Jacalyn LefevreHaviland, Sumaya Riedesel, MD 03/29/17 1353

## 2017-03-29 NOTE — Op Note (Signed)
Operative Note  Haley Lin 31 y.o. female [id number]  03/29/2017  Surgeon: Berna Buehelsea A Nicky Milhouse   Assistant: Magnus IvanSheila Bowman RNFA  Procedure performed: Laparoscopic Cholecystectomy  Preop diagnosis: Post-op diagnosis/intraop findings: same  Specimens: gallbladder  EBL: minimal  Complications: none  Description of procedure: After obtaining informed consent the patient was brought to the operating room. Prophylactic antibiotics and subcutaneous heparin were administered. SCD's were applied. General endotracheal anesthesia was initiated and a formal time-out was performed. The abdomen was prepped and draped in the usual sterile fashion and the abdomen was entered using an infraumbilical veress needle after instilling the site with local. Insufflation to 15mmHg was obtained, 5mm trocar and camera placed and gross inspection revealed no evidence of injury from our entry. The liver was noted to be hyperemic and noticeably enlarged. Two 5mm trocars were introduced in the right midclavicular and right anterior axillary lines under direct visualization and following infiltration with local. An 11mm trocar was placed in the epigastrium. The gallbladder was acutely inflamed and tensely distended. It was aspirated with the nezhat suction and frank pus was noted. The gallbladder was retracted cephalad and the infundibulum was retracted laterally. A combination of hook electrocautery and blunt dissection was utilized to clear the peritoneum from the neck and cystic duct, circumferentially isolating the cystic artery and cystic duct and lifting the gallbladder from the cystic plate. The critical view of safety was achieved with the cystic artery, cystic duct, and liver bed visualized between them with no other structures. The artery was clipped with a single clip proximally and distally and divided as was the cystic duct with two clips on the proximal end. The gallbladder was dissected from the liver  plate using electrocautery. A tear occurred on the liver side of the gallbladder wall during dissection and one large and one small stone were spilled- there were collected and no further stone spillage was ensured. Once freed the gallbladder was placed in an endocatch bag and removed through the epigastric trocar site. This had to be extended in order to remove the large specimen which was full of large, hard stones and the gallbladder tore within the bag spilling the remaining stones into the specimen retrieval bag. A small amount of bleeding on the liver bed was controlled with cautery. The right upper quadrant was irrigated copiously until the effluent was clear. Hemostasis was once again confirmed, and reinspection of the abdomen revealed no injuries. The clips were well opposed without any bile leak from the duct or the liver bed. The 11mm trocar site in the epigastrium was closed with a figure-of-eight 0 vicryl in the fascia under direct visualization using a PMI device. The abdomen was desufflated and all trocars removed. The skin incisions were closed with running subcuticular monocryl and Dermabond. The patient was awakened, extubated and transported to the recovery room in stable condition.   All counts were correct at the completion of the case.

## 2017-03-29 NOTE — Discharge Instructions (Signed)
Colecistostoma, cuidados posteriores (Cholecystostomy, Care After) Siga estas instrucciones durante las prximas semanas. Estas indicaciones le proporcionan informacin acerca de cmo deber cuidarse despus del procedimiento. El mdico tambin podr darle instrucciones ms especficas. El tratamiento ha sido planificado segn las prcticas mdicas actuales, pero en algunos casos pueden ocurrir problemas. Comunquese con el mdico si tiene algn problema o dudas despus del procedimiento. QU ESPERAR DESPUS DEL PROCEDIMIENTO Despus del procedimiento, es comn sentir dolor cerca del lugar del tubo de drenaje (catter) o de la incisin. INSTRUCCIONES PARA EL CUIDADO EN EL HOGAR Cuidado de la incisin  Siga las indicaciones del mdico acerca del cuidado de la incisin. Haga lo siguiente: ? Lvese las manos con agua y jabn antes de Multimedia programmercambiar las vendas (vendaje). Use desinfectante para manos si no dispone de [place] y [place]. ? Cambie el vendaje como se lo haya indicado el mdico. ? No retire los puntos (suturas), el QUALCOMMadhesivo para la piel o las tiras Boys Townadhesivas. Es posible que estos deban quedar puestos en la piel durante 2semanas o ms tiempo. Si los bordes de las tiras [address]adhesivas empiezan a despegarse y Scientific laboratory technicianenroscarse, puede recortar los que estn sueltos. No retire las tiras Agilent Technologiesadhesivas por completo a menos que el mdico se lo indique.  Controle Immunologistel lugar de la incisin y del drenaje todos los [address]  [address] para detectar signos de infeccin. Est atento a lo siguiente: ? Dolor, hinchazn o enrojecimiento. ? Lquido, sangre o pus. Instrucciones generales  Si lo enviaron a su casa con un drenaje quirrgico colocado, siga las indicaciones del mdico sobre cmo cuidarlo y cmo cuidar la bolsa de Production managerrecoleccin.  No tome baos de inmersin, no nade ni use el jacuzzi hasta que el mdico lo autorice. Pregntele al mdico si puede ducharse. Delle Reiningal vez solo le permitan tomar baos de Minervaesponja.  Siga las indicaciones del mdico  respecto de lo que puede comer o beber.  Tome los medicamentos de venta libre y los recetados solamente como se lo haya indicado el mdico.  OceanographerConcurra a todas las visitas de control como se lo haya indicado el mdico. Esto es importante. SOLICITE ATENCIN MDICA SI:  Hay enrojecimiento, hinchazn o dolor en el lugar de la incisin o del drenaje.  Tiene nuseas o vmitos. SOLICITE ATENCIN MDICA DE INMEDIATO SI:  El dolor abdominal empeora.  Siente mareos o se Emerson Electricdesmaya mientras est de pie.  Observa lquido, sangre o pus que emanan del lugar de la incisin o del drenaje.  Tiene fiebre.  Le falta el aire.  Tiene una frecuencia cardaca acelerada.  Los vmitos o las nuseas no desaparecen.  El tubo de Ringsteddrenaje se obstruye.  El tubo de Gratiotdrenaje se sale del abdomen. Esta informacin no tiene Theme park managercomo fin reemplazar el consejo del mdico. Asegrese de hacerle al mdico cualquier pregunta que tenga. Document Released: 05/12/2015 Document Revised: 05/12/2015 Document Reviewed: 12/02/2014 Elsevier Interactive Patient Education  2018 [employer]    CCS _____[facility] LAPAROSCOPIC SUR[facility]ERY: POST OP INSTRUCTIONS Always review your discharge instruction sheet given to you by the facility where your surgery was performed. IF YOU HAVE DISABILITY OR FAMILY LEAVE FORMS, YOU MUST BRING THEM TO THE OFFICE FOR PROCESSING.   DO NOT GIVE THEM TO YOUR DOCTOR.  1. A prescription for pain medication may be given to you upon discharge.  Take your pain medication as prescribed, if needed.  If narcotic pain medicine is not needed, then you may take acetaminophen (Tylenol) or ibuprofen (Advil) as needed. 2. Take your usually prescribed medications unless  otherwise directed. 3. If you need a refill on your pain medication, please contact your pharmacy.  They will contact our office to request authorization. Prescriptions will not be filled after 5pm or on week-ends. 4. You should  follow a light diet the first few days after arrival home, such as soup and crackers, etc.  Be sure to include lots of fluids daily. 5. Most patients will experience some swelling and bruising in the area of the incisions.  Ice packs will help.  Swelling and bruising can take several days to resolve.  6. It is common to experience some constipation if taking pain medication after surgery.  Increasing fluid intake and taking a stool softener (such as Colace) will usually help or prevent this problem from occurring.  A mild laxative (Milk of Magnesia or Miralax) should be taken according to package instructions if there are no bowel movements after 48 hours. 7. Unless discharge instructions indicate otherwise, you may remove your bandages 24-48 hours after surgery, and you may shower at that time.  You may have steri-strips (small skin tapes) in place directly over the incision.  These strips should be left on the skin for 7-10 days.  If your surgeon used skin glue on the incision, you may shower in 24 hours.  The glue will flake off over the next 2-3 weeks.  Any sutures or staples will be removed at the office during your follow-up visit. 8. ACTIVITIES:  You may resume regular (light) daily activities beginning the next day--such as daily self-care, walking, climbing stairs--gradually increasing activities as tolerated.  You may have sexual intercourse when it is comfortable.  Refrain from any heavy lifting or straining until approved by your doctor. a. You may drive when you are no longer taking prescription pain medication, you can comfortably wear a seatbelt, and you can safely maneuver your car and apply brakes. b. RETURN TO WORK:  __________________________________________________________ 9. You should see your doctor in the office for a follow-up appointment approximately 2-3 weeks after your surgery.  Make sure that you call for this appointment within a day or two after you arrive home to insure a  convenient appointment time. 10. OTHER INSTRUCTIONS: __________________________________________________________________________________________________________________________ __________________________________________________________________________________________________________________________ WHEN TO CALL YOUR DOCTOR: 1. Fever over 101.0 2. Inability to urinate 3. Continued bleeding from incision. 4. Increased pain, redness, or drainage from the incision. 5. Increasing abdominal pain  The clinic staff is available to answer your questions during regular business hours.  Please dont hesitate to call and ask to speak to one of the nurses for clinical concerns.  If you have a medical emergency, go to the nearest emergency room or call 911.  [facility] is always on call at the hospital. [address], [address], [place], [place]  [address] ? [address] [address], [place], [place]   [address] [phone] ? [phone] ? FAX [phone] Web site: [email or web address]

## 2017-03-29 NOTE — ED Notes (Signed)
Patient transported to Ultrasound 

## 2017-03-29 NOTE — Progress Notes (Deleted)
Haley Lin  Haley Lin 09/11/1985  [id number].    Requesting MD: Gilford Raid, MD Chief Complaint/Reason for Consult: cholecystitis  HPI:  [name] is a 31 y/o, G3P3, spanish-speaking female who presented to [facility] with a cc of abdominal pain that started 2 days ago. Pain is described as RUQ/right upper back pain that is sharp, constant, and has become gradually worse. Denies alleviating or aggravating factors. Associated symptoms include fever, nausea, vomiting. NKDA. Denies tobacco use. Denies HX abdominal surgery or regular medication use.   ED Workup: tachycardia,  WBC 13.3, ALT 81, total bilirubin 1.7 RUQ ultrasound significant for cholelithiasis, sludge, GB wall thickening.  ROS: Review of Systems  Constitutional: Positive for fever.  Respiratory: Negative for shortness of breath.   Cardiovascular: Negative for chest pain.  Gastrointestinal: Positive for abdominal pain, nausea and vomiting. Negative for blood in stool, constipation, diarrhea and melena.  All other systems reviewed and are negative.   No family history on file.  Past Medical History:  Diagnosis Date  . Medical history non-contributory     Past Surgical History:  Procedure Laterality Date  . NO PAST SURGERIES      Social History:  reports that she has never smoked. She has never used smokeless tobacco. She reports that she does not drink alcohol or use drugs.  Allergies: No Known Allergies   (Not in a hospital admission)  Blood pressure 110/66, pulse (!) 101, temperature 99 F (37.2 C), temperature source Oral, resp. rate 20, SpO2 97 %, unknown if currently breastfeeding. Physical Exam: Physical Exam  Constitutional: She is oriented to person, place, and time. She appears well-developed and well-nourished. No distress.  HENT:  Head: Normocephalic and atraumatic.  Right Ear: External ear normal.  Left Ear: External ear normal.  Nose: Nose normal.   Eyes: EOM are normal. Right eye exhibits no discharge. Left eye exhibits no discharge. No scleral icterus.  Neck: Normal range of motion. Neck supple. No tracheal deviation present.  Cardiovascular: Normal rate, regular rhythm, normal heart sounds and intact distal pulses.  Exam reveals no gallop and no friction rub.   No murmur heard. Pulmonary/Chest: Effort normal and breath sounds normal. No stridor. No respiratory distress. She has no wheezes. She has no rales.  Abdominal: Soft. Bowel sounds are normal. She exhibits no distension and no mass. There is tenderness (RUQ). There is no rebound. No hernia.  Musculoskeletal: Normal range of motion. She exhibits no edema, tenderness or deformity.  Neurological: She is alert and oriented to person, place, and time. No sensory deficit.  Skin: Skin is warm and dry. No rash noted. She is not diaphoretic.  Psychiatric: She has a normal mood and affect. Her behavior is normal.    Results for orders placed or performed during the hospital encounter of 03/29/17 (from the past 48 hour(s))  Lipase, blood     Status: None   Collection Time: 03/29/17 10:05 AM  Result Value Ref Range   Lipase 24 11 - 51 U/L  Comprehensive metabolic panel     Status: Abnormal   Collection Time: 03/29/17 10:05 AM  Result Value Ref Range   Sodium 137 135 - 145 mmol/L   Potassium 4.0 3.5 - 5.1 mmol/L   Chloride 105 101 - 111 mmol/L   CO2 22 22 - 32 mmol/L   Glucose, Bld 117 (H) 65 - 99 mg/dL   BUN 9 6 - 20 mg/dL   Creatinine, Ser 0.45 0.44 - 1.00 mg/dL   Calcium 9.1  8.9 - 10.3 mg/dL   Total Protein 8.3 (H) 6.5 - 8.1 g/dL   Albumin 4.5 3.5 - 5.0 g/dL   AST 38 15 - 41 U/L   ALT 81 (H) 14 - 54 U/L   Alkaline Phosphatase 94 38 - 126 U/L   Total Bilirubin 1.7 (H) 0.3 - 1.2 mg/dL   GFR calc non Af Amer >60 >60 mL/min   GFR calc Af Amer >60 >60 mL/min    Comment: (Lin) The eGFR has been calculated using the CKD EPI equation. This calculation has not been validated in  all clinical situations. eGFR's persistently <60 mL/min signify possible Chronic Kidney Disease.    Anion gap 10 5 - 15  CBC     Status: Abnormal   Collection Time: 03/29/17 10:05 AM  Result Value Ref Range   WBC 13.3 (H) 4.0 - 10.5 K/uL   RBC 4.41 3.87 - 5.11 MIL/uL   Hemoglobin 12.4 12.0 - 15.0 g/dL   HCT 36.7 36.0 - 46.0 %   MCV 83.2 78.0 - 100.0 fL   MCH 28.1 26.0 - 34.0 pg   MCHC 33.8 30.0 - 36.0 g/dL   RDW 12.6 11.5 - 15.5 %   Platelets 350 150 - 400 K/uL  Urinalysis, Routine w reflex microscopic     Status: Abnormal   Collection Time: 03/29/17 10:58 AM  Result Value Ref Range   Color, Urine AMBER (A) YELLOW    Comment: BIOCHEMICALS MAY BE AFFECTED BY COLOR   APPearance HAZY (A) CLEAR   Specific Gravity, Urine 1.026 1.005 - 1.030   pH 5.0 5.0 - 8.0   Glucose, UA NEGATIVE NEGATIVE mg/dL   Hgb urine dipstick NEGATIVE NEGATIVE   Bilirubin Urine NEGATIVE NEGATIVE   Ketones, ur NEGATIVE NEGATIVE mg/dL   Protein, ur >=300 (A) NEGATIVE mg/dL   Nitrite NEGATIVE NEGATIVE   Leukocytes, UA TRACE (A) NEGATIVE   RBC / HPF 0-5 0 - 5 RBC/hpf   WBC, UA 6-30 0 - 5 WBC/hpf   Bacteria, UA RARE (A) NONE SEEN   Squamous Epithelial / LPF 0-5 (A) NONE SEEN   Mucous PRESENT   POC urine preg, ED     Status: None   Collection Time: 03/29/17 11:04 AM  Result Value Ref Range   Preg Test, Ur NEGATIVE NEGATIVE    Comment:        THE SENSITIVITY OF THIS METHODOLOGY IS >24 mIU/mL    US Abdomen Limited Ruq  Result Date: 03/29/2017 CLINICAL DATA:  Right quadrant pain. EXAM: ULTRASOUND ABDOMEN LIMITED RIGHT UPPER QUADRANT COMPARISON:  Ultrasound 11/19/2015. FINDINGS: Gallbladder: Gallstones. Sludge. Gallbladder wall thickening to 6.3 mm. Cholecystitis cannot be excluded. Common bile duct: Diameter: 5.4 mm Liver: Increased hepatic echogenicity consistent with fatty infiltration and/or hepatocellular disease. No focal hepatic abnormality identified. IMPRESSION: 1. Gallstones. Sludge. Gallbladder  wall thickness 6.3 mm. Cholecystitis cannot be excluded . 2. Increased hepatic echogenicity consistent fatty infiltration and/or hepatocellular disease. Electronically Signed   By: Marcello Moores  Register   On: 03/29/2017 10:49    Assessment/Plan Acute calculous cholecystitis - RUQ pain, leukocytosis, RUQ U/S w/ cholelithiasis and wall thickening  - admit to [facility] service for lap chole with IOC today by Dr. Kae Heller - NPO, IVF - Rocephin  - AM labs   Hyperbilirubinemia - 1.7; perform intraoperative cholangiogram and repeat LFTs in AM.  FEN - NPO ID - Rocephin 7/26 VTE - SCD's, Lovenox post-operatively    Jill Alexanders, [facility] 03/29/2017, 1:14 PM Pager: [phone]  Consults: [phone] Mon-Fri 7:00 am-4:30 pm Sat-Sun 7:00 am-11:30 am

## 2017-03-29 NOTE — H&P (Signed)
[facility] Admission Note  Haley Lin 06/18/1986  [id number].    Requesting MD: Gilford Raid, MD Chief Complaint/Reason for Consult: cholecystitis  HPI:  [name] is a 31 y/o, G3P3, spanish-speaking female who presented to [facility] with a cc of abdominal pain that started 2 days ago. Pain is described as RUQ/right upper back pain that is sharp, constant, and has become gradually worse. Denies alleviating or aggravating factors. Associated symptoms include fever, nausea, vomiting. NKDA. Denies tobacco use. Denies HX abdominal surgery or regular medication use.   ED Workup: tachycardia,  WBC 13.3, ALT 81, total bilirubin 1.7 RUQ ultrasound significant for cholelithiasis, sludge, GB wall thickening.  ROS: Review of Systems  Constitutional: Positive for fever.  Respiratory: Negative for shortness of breath.   Cardiovascular: Negative for chest pain.  Gastrointestinal: Positive for abdominal pain, nausea and vomiting. Negative for blood in stool, constipation, diarrhea and melena.  All other systems reviewed and are negative.   No family history on file.      Past Medical History:  Diagnosis Date  . Medical history non-contributory          Past Surgical History:  Procedure Laterality Date  . NO PAST SURGERIES      Social History:  reports that she has never smoked. She has never used smokeless tobacco. She reports that she does not drink alcohol or use drugs.  Allergies: No Known Allergies   (Not in a hospital admission)  Blood pressure 110/66, pulse (!) 101, temperature 99 F (37.2 C), temperature source Oral, resp. rate 20, SpO2 97 %, unknown if currently breastfeeding. Physical Exam: Physical Exam  Constitutional: She is oriented to person, place, and time. She appears well-developed and well-nourished. No distress.  HENT:  Head: Normocephalic and atraumatic.  Right Ear: External ear normal.  Left Ear: External ear  normal.  Nose: Nose normal.  Eyes: EOM are normal. Right eye exhibits no discharge. Left eye exhibits no discharge. No scleral icterus.  Neck: Normal range of motion. Neck supple. No tracheal deviation present.  Cardiovascular: Normal rate, regular rhythm, normal heart sounds and intact distal pulses.  Exam reveals no gallop and no friction rub.   No murmur heard. Pulmonary/Chest: Effort normal and breath sounds normal. No stridor. No respiratory distress. She has no wheezes. She has no rales.  Abdominal: Soft. Bowel sounds are normal. She exhibits no distension and no mass. There is tenderness (RUQ). There is no rebound. No hernia.  Musculoskeletal: Normal range of motion. She exhibits no edema, tenderness or deformity.  Neurological: She is alert and oriented to person, place, and time. No sensory deficit.  Skin: Skin is warm and dry. No rash noted. She is not diaphoretic.  Psychiatric: She has a normal mood and affect. Her behavior is normal.    Lab Results Last 48 Hours        Results for orders placed or performed during the hospital encounter of 03/29/17 (from the past 48 hour(s))  Lipase, blood     Status: None   Collection Time: 03/29/17 10:05 AM  Result Value Ref Range   Lipase 24 11 - 51 U/L  Comprehensive metabolic panel     Status: Abnormal   Collection Time: 03/29/17 10:05 AM  Result Value Ref Range   Sodium 137 135 - 145 mmol/L   Potassium 4.0 3.5 - 5.1 mmol/L   Chloride 105 101 - 111 mmol/L   CO2 22 22 - 32 mmol/L   Glucose, Bld 117 (H) 65 - 99 mg/dL  BUN 9 6 - 20 mg/dL   Creatinine, Ser 0.45 0.44 - 1.00 mg/dL   Calcium 9.1 8.9 - 10.3 mg/dL   Total Protein 8.3 (H) 6.5 - 8.1 g/dL   Albumin 4.5 3.5 - 5.0 g/dL   AST 38 15 - 41 U/L   ALT 81 (H) 14 - 54 U/L   Alkaline Phosphatase 94 38 - 126 U/L   Total Bilirubin 1.7 (H) 0.3 - 1.2 mg/dL   GFR calc non Af Amer >60 >60 mL/min   GFR calc Af Amer >60 >60 mL/min    Comment: (NOTE) The eGFR has  been calculated using the CKD EPI equation. This calculation has not been validated in all clinical situations. eGFR's persistently <60 mL/min signify possible Chronic Kidney Disease.    Anion gap 10 5 - 15  CBC     Status: Abnormal   Collection Time: 03/29/17 10:05 AM  Result Value Ref Range   WBC 13.3 (H) 4.0 - 10.5 K/uL   RBC 4.41 3.87 - 5.11 MIL/uL   Hemoglobin 12.4 12.0 - 15.0 g/dL   HCT 36.7 36.0 - 46.0 %   MCV 83.2 78.0 - 100.0 fL   MCH 28.1 26.0 - 34.0 pg   MCHC 33.8 30.0 - 36.0 g/dL   RDW 12.6 11.5 - 15.5 %   Platelets 350 150 - 400 K/uL  Urinalysis, Routine w reflex microscopic     Status: Abnormal   Collection Time: 03/29/17 10:58 AM  Result Value Ref Range   Color, Urine AMBER (A) YELLOW    Comment: BIOCHEMICALS MAY BE AFFECTED BY COLOR   APPearance HAZY (A) CLEAR   Specific Gravity, Urine 1.026 1.005 - 1.030   pH 5.0 5.0 - 8.0   Glucose, UA NEGATIVE NEGATIVE mg/dL   Hgb urine dipstick NEGATIVE NEGATIVE   Bilirubin Urine NEGATIVE NEGATIVE   Ketones, ur NEGATIVE NEGATIVE mg/dL   Protein, ur >=300 (A) NEGATIVE mg/dL   Nitrite NEGATIVE NEGATIVE   Leukocytes, UA TRACE (A) NEGATIVE   RBC / HPF 0-5 0 - 5 RBC/hpf   WBC, UA 6-30 0 - 5 WBC/hpf   Bacteria, UA RARE (A) NONE SEEN   Squamous Epithelial / LPF 0-5 (A) NONE SEEN   Mucous PRESENT   POC urine preg, ED     Status: None   Collection Time: 03/29/17 11:04 AM  Result Value Ref Range   Preg Test, Ur NEGATIVE NEGATIVE    Comment:        THE SENSITIVITY OF THIS METHODOLOGY IS >24 mIU/mL       Imaging Results (Last 48 hours)  US Abdomen Limited Ruq  Result Date: 03/29/2017 CLINICAL DATA:  Right quadrant pain. EXAM: ULTRASOUND ABDOMEN LIMITED RIGHT UPPER QUADRANT COMPARISON:  Ultrasound 11/19/2015. FINDINGS: Gallbladder: Gallstones. Sludge. Gallbladder wall thickening to 6.3 mm. Cholecystitis cannot be excluded. Common bile duct: Diameter: 5.4 mm Liver: Increased hepatic  echogenicity consistent with fatty infiltration and/or hepatocellular disease. No focal hepatic abnormality identified. IMPRESSION: 1. Gallstones. Sludge. Gallbladder wall thickness 6.3 mm. Cholecystitis cannot be excluded . 2. Increased hepatic echogenicity consistent fatty infiltration and/or hepatocellular disease. Electronically Signed   By: Marcello Moores  Register   On: 03/29/2017 10:49     Assessment/Plan Acute calculous cholecystitis - RUQ pain, leukocytosis, RUQ U/S w/ cholelithiasis and wall thickening  - admit to [facility] service for lap chole with IOC today by Dr. Kae Heller - NPO, IVF - Rocephin  - AM labs   Hyperbilirubinemia - 1.7; perform intraoperative cholangiogram and repeat LFTs in  AM.  FEN - NPO ID - Rocephin 7/26 VTE - SCD's, Lovenox post-operatively    Jill Alexanders, [facility] 03/29/2017, 1:14 PM Pager: [phone] Consults: [phone] Mon-Fri 7:00 am-4:30 pm Sat-Sun 7:00 am-11:30 am

## 2017-03-29 NOTE — Anesthesia Preprocedure Evaluation (Signed)
Anesthesia Evaluation  Patient identified by MRN, date of birth, ID band Patient awake    Reviewed: Allergy & Precautions, NPO status , Patient's Chart, lab work & pertinent test results  Airway Mallampati: I       Dental no notable dental hx. (+) Teeth Intact   Pulmonary neg pulmonary ROS,    Pulmonary exam normal breath sounds clear to auscultation       Cardiovascular negative cardio ROS Normal cardiovascular exam Rhythm:Regular Rate:Normal     Neuro/Psych negative neurological ROS  negative psych ROS   GI/Hepatic negative GI ROS, Neg liver ROS,   Endo/Other  negative endocrine ROS  Renal/GU negative Renal ROS  negative genitourinary   Musculoskeletal negative musculoskeletal ROS (+)   Abdominal Normal abdominal exam  (+)   Peds  Hematology negative hematology ROS (+)   Anesthesia Other Findings   Reproductive/Obstetrics negative OB ROS                             Anesthesia Physical Anesthesia Plan  ASA: II  Anesthesia Plan: General   Post-op Pain Management:    Induction: Intravenous  PONV Risk Score and Plan: 4 or greater and Ondansetron, Dexamethasone, Propofol, Midazolam and Scopolamine patch - Pre-op  Airway Management Planned: Oral ETT  Additional Equipment:   Intra-op Plan:   Post-operative Plan: Extubation in OR  Informed Consent: I have reviewed the patients History and Physical, chart, labs and discussed the procedure including the risks, benefits and alternatives for the proposed anesthesia with the patient or authorized representative who has indicated his/her understanding and acceptance.   Dental advisory given  Plan Discussed with: CRNA and Surgeon  Anesthesia Plan Comments:         Anesthesia Quick Evaluation

## 2017-03-29 NOTE — Anesthesia Procedure Notes (Signed)
Procedure Name: Intubation Date/Time: 03/29/2017 3:42 PM Performed by: Reine JustFLOWERS, Elazar Argabright T Pre-anesthesia Checklist: Patient identified, Emergency Drugs available, Suction available, Patient being monitored and Timeout performed Patient Re-evaluated:Patient Re-evaluated prior to induction Oxygen Delivery Method: Circle system utilized and Simple face mask Preoxygenation: Pre-oxygenation with 100% oxygen Induction Type: IV induction Ventilation: Mask ventilation without difficulty Laryngoscope Size: Miller and 2 Grade View: Grade I Tube type: Oral Tube size: 7.0 mm Number of attempts: 1 Airway Equipment and Method: Patient positioned with wedge pillow and Stylet Placement Confirmation: ETT inserted through vocal cords under direct vision,  positive ETCO2 and breath sounds checked- equal and bilateral Secured at: 19 cm Tube secured with: Tape Dental Injury: Teeth and Oropharynx as per pre-operative assessment

## 2017-03-29 NOTE — ED Triage Notes (Signed)
preswnts with upper abdominal pain began last night associated with nausea, emesis x1 and fever. Endorses subjective fever at home, pain is described as annoying. Tried Ibuprofen last night with no relief. HR 124, denies PMH. Pt is breast feeding and 10 month post partum. Denies diarrhea. This is the third time she has had this pain, she went to doctor before and was told to take ibuprofen for the pain-they didn't tell her if anything was wrong at that time.

## 2017-03-30 ENCOUNTER — Encounter (HOSPITAL_COMMUNITY): Payer: Self-pay | Admitting: Surgery

## 2017-03-30 LAB — COMPREHENSIVE METABOLIC PANEL
ALBUMIN: 3.4 g/dL — AB (ref 3.5–5.0)
ALT: 159 U/L — ABNORMAL HIGH (ref 14–54)
ANION GAP: 9 (ref 5–15)
AST: 122 U/L — ABNORMAL HIGH (ref 15–41)
Alkaline Phosphatase: 98 U/L (ref 38–126)
BILIRUBIN TOTAL: 1.6 mg/dL — AB (ref 0.3–1.2)
BUN: 7 mg/dL (ref 6–20)
CHLORIDE: 105 mmol/L (ref 101–111)
CO2: 21 mmol/L — ABNORMAL LOW (ref 22–32)
Calcium: 8.5 mg/dL — ABNORMAL LOW (ref 8.9–10.3)
Creatinine, Ser: 0.44 mg/dL (ref 0.44–1.00)
GFR calc Af Amer: >60 mL/min (ref >60)
Glucose, Bld: 117 mg/dL — ABNORMAL HIGH (ref 65–99)
POTASSIUM: 3.6 mmol/L (ref 3.5–5.1)
Sodium: 135 mmol/L (ref 135–145)
TOTAL PROTEIN: 7.1 g/dL (ref 6.5–8.1)

## 2017-03-30 LAB — HIV ANTIBODY (ROUTINE TESTING W REFLEX): HIV SCREEN 4TH GENERATION: NONREACTIVE

## 2017-03-30 MED ORDER — OXYCODONE HCL 5 MG PO TABS: 5.0000 mg | ORAL_TABLET | ORAL | 0 refills | Status: DC | PRN

## 2017-03-30 MED ORDER — OXYCODONE HCL 5 MG PO TABS
5.0000 mg | ORAL_TABLET | ORAL | Status: DC | PRN
  Administered 2017-03-30: 5 mg via ORAL
  Administered 2017-03-30: 10 mg via ORAL
  Filled 2017-03-30: qty 2
  Filled 2017-03-30: qty 1

## 2017-03-30 MED ORDER — ACETAMINOPHEN 325 MG PO TABS: 650.0000 mg | ORAL_TABLET | Freq: Four times a day (QID) | ORAL | Status: DC | PRN

## 2017-03-30 NOTE — Progress Notes (Signed)
1 Day Post-Op    CC:  Abdominal pain  Subjective: Spanish interpreter [phone] was used during this exam. Patient is doing well postop she is sore she has been out of bed nor has she eaten anything but clear liquids. Port sites look good. She has no complaints this a.m.  Objective: Vital signs in last 24 hours: Temp:  [98 F (36.7 C)-100.4 F (38 C)] 98 F (36.7 C) (07/27 0653) Pulse Rate:  [86-128] 92 (07/27 0653) Resp:  [16-21] 18 (07/27 0653) BP: (98-122)/(55-76) 98/55 (07/27 0653) SpO2:  [95 %-99 %] 96 % (07/27 0653) Weight:  [64.9 kg (143 lb)] 64.9 kg (143 lb) (07/26 1458) Last BM Date: 03/28/17 IV 3400 1500 urine recorded Low grade fever last PM Afebrile this AM LFT's  Stable - will recheck as an OP NO IOC  Intake/Output from previous day: 07/26 0701 - 07/27 0700 In: 3483.3 [I.V.:2433.3; IV Piggyback:1050] Out: 1550 [Urine:1500; Blood:50] Intake/Output this shift: No intake/output data recorded.  General appearance: alert, cooperative and no distress Resp: clear to auscultation bilaterally GI: Soft, sore, port sites all look good.  Lab Results:   Recent Labs  03/29/17 1005  WBC 13.3*  HGB 12.4  HCT 36.7  PLT 350    BMET  Recent Labs  03/29/17 1005 03/30/17 0448  NA 137 135  K 4.0 3.6  CL 105 105  CO2 22 21*  GLUCOSE 117* 117*  BUN 9 7  CREATININE 0.45 0.44  CALCIUM 9.1 8.5*   PT/INR No results for input(s): LABPROT, INR in the last 72 hours.   Recent Labs Lab 03/29/17 1005 03/30/17 0448  AST 38 122*  ALT 81* 159*  ALKPHOS 94 98  BILITOT 1.7* 1.6*  PROT 8.3* 7.1  ALBUMIN 4.5 3.4*     Lipase     Component Value Date/Time   LIPASE 24 03/29/2017 1005     Prior to Admission medications   Medication Sig Start Date End Date Taking? Authorizing Provider  ibuprofen (ADVIL,MOTRIN) 600 MG tablet Take 1 tablet (600 mg total) by mouth every 6 (six) hours. 05/17/16  Yes Degele, Kandra NicolasJulie P, MD  Prenatal Vit-Fe Fumarate-FA (PRENATAL  MULTIVITAMIN) TABS Take 1 tablet by mouth at bedtime.    Yes [provider]    Medications: . docusate sodium  100 mg Oral BID  . enoxaparin (LOVENOX) injection  40 mg Subcutaneous Q24H  . prenatal multivitamin  1 tablet Oral QHS   . sodium chloride 100 mL/hr at 03/30/17 0604  . cefTRIAXone (ROCEPHIN)  IV     Anti-infectives    Start     Dose/Rate Route Frequency Ordered Stop   03/30/17 1200  cefTRIAXone (ROCEPHIN) 2 g in dextrose 5 % 50 mL IVPB    Comments:  Pharmacy may adjust dosing strength / duration / interval for maximal efficacy   2 g 100 mL/hr over 30 Minutes Intravenous Every 24 hours 03/29/17 1344     03/29/17 1145  cefTRIAXone (ROCEPHIN) 2 g in dextrose 5 % 50 mL IVPB     2 g 100 mL/hr over 30 Minutes Intravenous  Once 03/29/17 1143 03/29/17 1245     Assessment/Plan Acute cholecystitis S/p Laparoscopic cholecystectomy, 03/29/17, Dr. Twana Firsthelsea Conner. FEN:  IV fluids/REg diet ID: Pre op DVT:  Lovenox   Plan: We discussed her instructions and plan of the day in detail with the interpreter. We'll advance her diet, mobilize in the halls. I explained that when she can eat drink walking void without difficulty she could go  home. I explained pain medications including Tylenol and ibuprofen, we would also give her some oxycodone for pain. She can shower, and we will get a repeat CMP prior to her coming back to the office.   LOS: 0 days    Carliyah Cotterman 03/30/2017 [phone]

## 2017-04-03 NOTE — Discharge Summary (Signed)
Physician Discharge Summary  Patient ID: Haley Lin MRN: [id number] DOB/AGE: 1986/08/01 31 y.o. CC:  Abdominal pain    Admit date: 03/29/2017 Discharge date: 03/30/2017  Admission Diagnoses:  Acute calculous cholecystitis - RUQ pain, leukocytosis, RUQ U/S w/ cholelithiasis and wall thickening   Discharge Diagnoses:  Acute cholecystitis Active Problems:   Cholecystitis   PROCEDURES: S/p Laparoscopic cholecystectomy, 03/29/17, Dr. Twana Firsthelsea Conner.    Hospital Course:  HPI:  Haley Lin is a 31 y/o, G3P3, spanish-speaking female who [facility] with a cc of abdominal pain that started 2 days ago. Pain is described as RUQ/right upper back pain that is sharp, constant, and has become gradually worse. Denies alleviating or aggravating factors. Associated symptoms include fever, nausea, vomiting. NKDA. Denies tobacco use. Denies HX abdominal surgery or regular medication use.  ED Workup:tachycardia, WBC 13.3, ALT 81, total bilirubin 1.7 RUQ ultrasound significant for cholelithiasis, sludge, GB wall thickening  She was admitted and taken to the OR by Dr. Fredricka Bonineonnor.  She did well and was ready for discharge the following AM.    Condition on discharge:  Improved   CBC Latest Ref Rng & Units 03/29/2017 05/15/2016 10/25/2012  WBC 4.0 - 10.5 K/uL 13.3(H) 13.5(H) 14.4(H)  Hemoglobin 12.0 - 15.0 g/dL 21.312.4 11.7(L) 12.4  Hematocrit 36.0 - 46.0 % 36.7 33.4(L) 35.9(L)  Platelets 150 - 400 K/uL 350 261 178    CMP Latest Ref Rng & Units 03/30/2017 03/29/2017 10/25/2012  Glucose 65 - 99 mg/dL 086(V117(H) 784(O117(H) 88  BUN 6 - 20 mg/dL 7 9 9   Creatinine 0.44 - 1.00 mg/dL 9.620.44 9.520.45 8.410.64  Sodium 135 - 145 mmol/L 135 137 137  Potassium 3.5 - 5.1 mmol/L 3.6 4.0 3.9  Chloride 101 - 111 mmol/L 105 105 102  CO2 22 - 32 mmol/L 21(L) 22 20  Calcium 8.9 - 10.3 mg/dL 3.2(G8.5(L) 9.1 9.1  Total Protein 6.5 - 8.1 g/dL 7.1 4.0(N8.3(H) 7.1  Total Bilirubin 0.3 - 1.2 mg/dL 0.2(V1.6(H) 2.5(D1.7(H) 0.4  Alkaline Phos  38 - 126 U/L 98 94 228(H)  AST 15 - 41 U/L 122(H) 38 34  ALT 14 - 54 U/L 159(H) 81(H) 40(H)    Disposition: 01-Home or Self Care   Allergies as of 03/30/2017   No Known Allergies     Medication List    TAKE these medications   acetaminophen 325 MG tablet Commonly known as:  TYLENOL Take 2 tablets (650 mg total) by mouth every 6 (six) hours as needed for mild pain or moderate pain (or temp > 100).   ibuprofen 600 MG tablet Commonly known as:  ADVIL,MOTRIN Take 1 tablet (600 mg total) by mouth every 6 (six) hours.   oxyCODONE 5 MG immediate release tablet Commonly known as:  Oxy IR/ROXICODONE Take 1-2 tablets (5-10 mg total) by mouth every 4 (four) hours as needed for moderate pain.   prenatal multivitamin Tabs tablet Take 1 tablet by mouth at bedtime.      Follow-up Information    [facility] on 04/05/2017.   Specialty:  General Surgery Why:  Your appointment is at 3:00 PM, be at the office 30 minutes early for check in.  Bring insurance information and photo ID. Have blood chemistries checked 1-2 days before your visit. Lab is at [address] Contact information: [address] [place] [address] [phone]           Signed: Sherrie GeorgeJENNINGS,Nashaun Hillmer 04/03/2017, 2:18 PM

## 2017-09-04 NOTE — L&D Delivery Note (Signed)
Delivery Note At 12:17 AM a vigorous viable and healthy female was delivered via Vaginal, Spontaneous (Presentation: LOT, rotated to LOP).  APGAR: 9, 9; weight  pending.   Placenta status: spontaneous, intact.  Cord: 3VC with the following complications: loose nuchal, delivered through. Pitocin bolus infusing.  Cord pH: NA.  Anesthesia:   Episiotomy: None Lacerations: None Suture Repair: NA Est. Blood Loss (mL): 50  Mom to postpartum.  Baby to Couplet care / Skin to Skin.  Please schedule this patient for Postpartum visit in: 4 weeks with the following provider: Any provider For C/S patients schedule nurse incision check in weeks 2 weeks: no Low risk pregnancy complicated by: NA Delivery mode:  SVD Anticipated Birth Control:  Condoms PP Procedures needed: None  Schedule Integrated BH visit: no  Haley Lin 02/19/2018, 12:33 AM

## 2017-11-14 LAB — OB RESULTS CONSOLE HIV ANTIBODY (ROUTINE TESTING): HIV: NONREACTIVE

## 2017-11-14 LAB — OB RESULTS CONSOLE GC/CHLAMYDIA
CHLAMYDIA, DNA PROBE: NEGATIVE
Gonorrhea: NEGATIVE

## 2017-11-14 LAB — OB RESULTS CONSOLE ABO/RH: RH TYPE: POSITIVE

## 2017-11-14 LAB — OB RESULTS CONSOLE ANTIBODY SCREEN: Antibody Screen: NEGATIVE

## 2017-11-14 LAB — OB RESULTS CONSOLE RUBELLA ANTIBODY, IGM: Rubella: IMMUNE

## 2017-11-14 LAB — OB RESULTS CONSOLE HEPATITIS B SURFACE ANTIGEN: Hepatitis B Surface Ag: NEGATIVE

## 2017-11-14 LAB — OB RESULTS CONSOLE RPR: RPR: NONREACTIVE

## 2018-02-07 LAB — OB RESULTS CONSOLE GBS: STREP GROUP B AG: NEGATIVE

## 2018-02-18 ENCOUNTER — Other Ambulatory Visit: Payer: Self-pay

## 2018-02-18 ENCOUNTER — Encounter (HOSPITAL_COMMUNITY): Payer: Self-pay

## 2018-02-18 ENCOUNTER — Inpatient Hospital Stay (HOSPITAL_COMMUNITY): Payer: Medicaid Other | Admitting: Anesthesiology

## 2018-02-18 ENCOUNTER — Inpatient Hospital Stay (HOSPITAL_COMMUNITY)
Admission: AD | Admit: 2018-02-18 | Discharge: 2018-02-20 | DRG: 807 | Disposition: A | Discharge status: Home / Self Care | Payer: Medicaid Other | Attending: Obstetrics and Gynecology | Admitting: Obstetrics and Gynecology

## 2018-02-18 DIAGNOSIS — Z3A38 38 weeks gestation of pregnancy: Secondary | ICD-10-CM | POA: Diagnosis not present

## 2018-02-18 DIAGNOSIS — Z3483 Encounter for supervision of other normal pregnancy, third trimester: Secondary | ICD-10-CM | POA: Diagnosis present

## 2018-02-18 DIAGNOSIS — O99214 Obesity complicating childbirth: Secondary | ICD-10-CM | POA: Diagnosis present

## 2018-02-18 DIAGNOSIS — E669 Obesity, unspecified: Secondary | ICD-10-CM | POA: Diagnosis present

## 2018-02-18 DIAGNOSIS — Z3A39 39 weeks gestation of pregnancy: Secondary | ICD-10-CM | POA: Diagnosis not present

## 2018-02-18 LAB — CBC
HEMATOCRIT: 35.7 % — AB (ref 36.0–46.0)
Hemoglobin: 12.1 g/dL (ref 12.0–15.0)
MCH: 28.9 pg (ref 26.0–34.0)
MCHC: 33.9 g/dL (ref 30.0–36.0)
MCV: 85.2 fL (ref 78.0–100.0)
PLATELETS: 296 10*3/uL (ref 150–400)
RBC: 4.19 MIL/uL (ref 3.87–5.11)
RDW: 12.9 % (ref 11.5–15.5)
WBC: 11.9 10*3/uL — AB (ref 4.0–10.5)

## 2018-02-18 LAB — TYPE AND SCREEN
ABO/RH(D): O POS
ANTIBODY SCREEN: NEGATIVE

## 2018-02-18 MED ORDER — FENTANYL 2.5 MCG/ML BUPIVACAINE 1/10 % EPIDURAL INFUSION (WH - ANES)
INTRAMUSCULAR | Status: AC
  Filled 2018-02-18: qty 100

## 2018-02-18 MED ORDER — PHENYLEPHRINE 40 MCG/ML (10ML) SYRINGE FOR IV PUSH (FOR BLOOD PRESSURE SUPPORT)
80.0000 ug | PREFILLED_SYRINGE | INTRAVENOUS | Status: DC | PRN
  Filled 2018-02-18: qty 5

## 2018-02-18 MED ORDER — SOD CITRATE-CITRIC ACID 500-334 MG/5ML PO SOLN: 30.0000 mL | ORAL | Status: DC | PRN

## 2018-02-18 MED ORDER — FLEET ENEMA 7-19 GM/118ML RE ENEM: 1.0000 | ENEMA | RECTAL | Status: DC | PRN

## 2018-02-18 MED ORDER — FENTANYL 2.5 MCG/ML BUPIVACAINE 1/10 % EPIDURAL INFUSION (WH - ANES)
14.0000 mL/h | INTRAMUSCULAR | Status: DC | PRN
  Administered 2018-02-18: 11 mL/h via EPIDURAL

## 2018-02-18 MED ORDER — LACTATED RINGERS IV SOLN: 500.0000 mL | Freq: Once | INTRAVENOUS | Status: DC

## 2018-02-18 MED ORDER — EPHEDRINE 5 MG/ML INJ
10.0000 mg | INTRAVENOUS | Status: DC | PRN
Start: 2018-02-18 — End: 2018-02-19
  Filled 2018-02-18: qty 2

## 2018-02-18 MED ORDER — LACTATED RINGERS IV SOLN: 500.0000 mL | INTRAVENOUS | Status: DC | PRN

## 2018-02-18 MED ORDER — AMPICILLIN SODIUM 2 G IJ SOLR
2.0000 g | Freq: Once | INTRAMUSCULAR | Status: DC
  Filled 2018-02-18: qty 2000

## 2018-02-18 MED ORDER — LIDOCAINE HCL (PF) 1 % IJ SOLN
INTRAMUSCULAR | Status: AC
  Filled 2018-02-18: qty 30

## 2018-02-18 MED ORDER — DIPHENHYDRAMINE HCL 50 MG/ML IJ SOLN: 12.5000 mg | INTRAMUSCULAR | Status: DC | PRN

## 2018-02-18 MED ORDER — EPHEDRINE 5 MG/ML INJ
10.0000 mg | INTRAVENOUS | Status: DC | PRN
  Filled 2018-02-18: qty 2

## 2018-02-18 MED ORDER — LIDOCAINE HCL (PF) 1 % IJ SOLN
INTRAMUSCULAR | Status: DC | PRN
  Administered 2018-02-18: 5 mL via EPIDURAL
  Administered 2018-02-18: 3 mL via EPIDURAL

## 2018-02-18 MED ORDER — LIDOCAINE HCL (PF) 1 % IJ SOLN
30.0000 mL | INTRAMUSCULAR | Status: DC | PRN
  Filled 2018-02-18: qty 30

## 2018-02-18 MED ORDER — LACTATED RINGERS IV SOLN
INTRAVENOUS | Status: DC
  Administered 2018-02-18 (×2): via INTRAVENOUS

## 2018-02-18 MED ORDER — OXYCODONE-ACETAMINOPHEN 5-325 MG PO TABS: 2.0000 | ORAL_TABLET | ORAL | Status: DC | PRN

## 2018-02-18 MED ORDER — OXYTOCIN 40 UNITS IN LACTATED RINGERS INFUSION - SIMPLE MED
INTRAVENOUS | Status: AC
  Filled 2018-02-18: qty 1000

## 2018-02-18 MED ORDER — LACTATED RINGERS IV SOLN
500.0000 mL | Freq: Once | INTRAVENOUS | Status: AC
  Administered 2018-02-18: 500 mL via INTRAVENOUS

## 2018-02-18 MED ORDER — ONDANSETRON HCL 4 MG/2ML IJ SOLN: 4.0000 mg | Freq: Four times a day (QID) | INTRAMUSCULAR | Status: DC | PRN

## 2018-02-18 MED ORDER — OXYCODONE-ACETAMINOPHEN 5-325 MG PO TABS: 1.0000 | ORAL_TABLET | ORAL | Status: DC | PRN

## 2018-02-18 MED ORDER — OXYTOCIN 40 UNITS IN LACTATED RINGERS INFUSION - SIMPLE MED
2.5000 [IU]/h | INTRAVENOUS | Status: DC
Start: 2018-02-18 — End: 2018-02-19

## 2018-02-18 MED ORDER — OXYTOCIN BOLUS FROM INFUSION
500.0000 mL | Freq: Once | INTRAVENOUS | Status: AC
  Administered 2018-02-19: 500 mL via INTRAVENOUS

## 2018-02-18 MED ORDER — PHENYLEPHRINE 40 MCG/ML (10ML) SYRINGE FOR IV PUSH (FOR BLOOD PRESSURE SUPPORT)
PREFILLED_SYRINGE | INTRAVENOUS | Status: AC
  Filled 2018-02-18: qty 20

## 2018-02-18 MED ORDER — ACETAMINOPHEN 325 MG PO TABS: 650.0000 mg | ORAL_TABLET | ORAL | Status: DC | PRN

## 2018-02-18 NOTE — H&P (Addendum)
LABOR AND DELIVERY ADMISSION HISTORY AND PHYSICAL NOTE  Haley Lin is a 32 y.o. female [phone] with IUP at 340w3d by 18 wk [place] presenting for SOL.  She reports positive fetal movement. She denies leakage of fluid or vaginal bleeding. She felt onset of contractions this afternoon.  Prenatal History/Complications: PNC at HD Pregnancy complications:  - renal pelviectasis on initial [place], resolved  Past Medical History: Past Medical History:  Diagnosis Date  . Medical history non-contributory     Past Surgical History: Past Surgical History:  Procedure Laterality Date  . CHOLECYSTECTOMY N/A 03/29/2017   Procedure: LAPAROSCOPIC CHOLECYSTECTOMY;  Surgeon: Berna Bue, MD;  Location: [facility] OR;  Service: General;  Laterality: N/A;  . LAPAROSCOPIC CHOLECYSTECTOMY  03/29/2017    Obstetrical History: OB History    Gravida  4   Para  3   Term  3   Preterm  0   AB  0   Living  3     SAB  0   TAB  0   Ectopic  0   Multiple  0   Live Births  2           Social History: Social History   Socioeconomic History  . Marital status: Married    Spouse name: Not on file  . Number of children: Not on file  . Years of education: Not on file  . Highest education level: Not on file  Occupational History  . Not on file  Social Needs  . Financial resource strain: Not on file  . Food insecurity:    Worry: Not on file    Inability: Not on file  . Transportation needs:    Medical: Not on file    Non-medical: Not on file  Tobacco Use  . Smoking status: Never Smoker  . Smokeless tobacco: Never Used  Substance and Sexual Activity  . Alcohol use: No  . Drug use: No  . Sexual activity: Not Currently    Birth control/protection: None  Lifestyle  . Physical activity:    Days per week: Not on file    Minutes per session: Not on file  . Stress: Not on file  Relationships  . Social connections:    Talks on phone: Not on file    Gets together: Not on file    Attends religious service: Not on file    Active member of club or organization: Not on file    Attends meetings of clubs or organizations: Not on file    Relationship status: Not on file  Other Topics Concern  . Not on file  Social History Narrative  . Not on file    Family History: No family history on file.  Allergies: No Known Allergies  Medications Prior to Admission  Medication Sig Dispense Refill Last Dose  . acetaminophen (TYLENOL) 325 MG tablet Take 2 tablets (650 mg total) by mouth every 6 (six) hours as needed for mild pain or moderate pain (or temp > 100).     Marland Kitchen ibuprofen (ADVIL,MOTRIN) 600 MG tablet Take 1 tablet (600 mg total) by mouth every 6 (six) hours. 60 tablet 0 03/29/2017 at Unknown time  . oxyCODONE (OXY IR/ROXICODONE) 5 MG immediate release tablet Take 1-2 tablets (5-10 mg total) by mouth every 4 (four) hours as needed for moderate pain. 15 tablet 0   . Prenatal Vit-Fe Fumarate-FA (PRENATAL MULTIVITAMIN) TABS Take 1 tablet by mouth at bedtime.    03/28/2017 at Unknown time  Review of Systems  All systems reviewed and negative except as stated in HPI  Physical Exam Blood pressure 121/81, pulse 91, height 4\' 9"  (1.448 m), weight 63.6 kg (140 lb 3 oz), unknown if currently breastfeeding. General appearance: alert, oriented, NAD Lungs: normal respiratory effort Heart: regular rate Abdomen: soft, non-tender; gravid, FH appropriate for GA Extremities: No calf swelling or tenderness Presentation: cephalic by sutures Fetal monitoring: 130 bpm/moderate variability/+ accel, no decel Uterine activity: q402mins Dilation: 8.5 Effacement (%): 90 Station: Plus 1 Exam by:: Talmadge Coventryhornton, RN  Prenatal labs: ABO, Rh:  O positive Antibody:  neg Rubella:  immune RPR:   neg HBsAg:   neg HIV: Non Reactive (07/26 1936)  GC/Chlamydia: neg GBS:   neg 2-hr GTT: 1 hr GTT 103 Genetic screening:  neg Anatomy US: bilateral renal pelviectasis, resolved on f/u US  Prenatal  Transfer Tool  Maternal Diabetes: No Genetic Screening: Normal Maternal Ultrasounds/Referrals: Abnormal:  Findings:   Fetal Kidney Anomalies, resolved Fetal Ultrasounds or other Referrals:  None Maternal Substance Abuse:  No Significant Maternal Medications:  None Significant Maternal Lab Results: None  No results found for this or any previous visit (from the past 24 hour(s)).  Patient Active Problem List   Diagnosis Date Noted  . Normal labor 02/18/2018  . Cholecystitis 03/29/2017  . Active labor 05/15/2016  . SVD (spontaneous vaginal delivery) 10/27/2012    Assessment: Haley Lin is a 32 y.o. G4P3003 at Unknown here for SOL.  #Labor: SVE on admission 9.5/100/-1. Expectant management. Anticipate SVD.  #Pain: Per patient request #FWB: Cat 1 FHT #ID:  GBS negative, confirmed with HD records #MOF: breast #MOC:condoms #Circ:  no  GrenadaBrittany P Hipkins 02/18/2018, 7:21 PM  I confirm that I have verified the information documented in the resident's note and that I have also personally reperformed the physical exam and all medical decision making activities. The patient was seen and examined by me also Agree with note NST reactive and reassuring UCs as listed Cervical exams as listed in note  Wants epidural Discussed may not have time but we will try.  Aviva SignsWilliams, Lynwood Kubisiak L, CNM

## 2018-02-18 NOTE — Anesthesia Procedure Notes (Signed)
Epidural Patient location during procedure: OB Start time: 02/18/2018 8:25 PM End time: 02/18/2018 8:31 PM  Staffing Anesthesiologist: Cecile Hearingurk, Taegen Lennox Edward, MD Performed: anesthesiologist   Preanesthetic Checklist Completed: patient identified, pre-op evaluation, timeout performed, IV checked, risks and benefits discussed and monitors and equipment checked  Epidural Patient position: sitting Prep: DuraPrep Patient monitoring: blood pressure and continuous pulse ox Approach: midline Location: L3-L4 Injection technique: LOR air  Needle:  Needle type: Tuohy  Needle gauge: 17 G Needle length: 9 cm Needle insertion depth: 5 cm Catheter size: 19 Gauge Catheter at skin depth: 10 cm Test dose: negative and Other (1% Lidocaine)  Additional Notes Patient identified.  Risk benefits discussed including failed block, incomplete pain control, headache, nerve damage, paralysis, blood pressure changes, nausea, vomiting, reactions to medication both toxic or allergic, and postpartum back pain.  Patient expressed understanding and wished to proceed.  All questions were answered.  Sterile technique used throughout procedure and epidural site dressed with sterile barrier dressing. No paresthesia or other complications noted. The patient did not experience any signs of intravascular injection such as tinnitus or metallic taste in mouth nor signs of intrathecal spread such as rapid motor block. Please see nursing notes for vital signs. Reason for block:procedure for pain

## 2018-02-18 NOTE — MAU Note (Signed)
Pt brought back from lobby, contractions 4th baby, interpreter at bedside. SVE ant.lip

## 2018-02-18 NOTE — Progress Notes (Addendum)
Patient ID: Haley Lin, female   DOB: [birth year], 32 y.o.   MRN: [id number] Called to room due to pt feeling pressure Vitals:   02/18/18 1902 02/18/18 1903  BP: 121/81   Pulse: 91   Weight:  140 lb 3 oz (63.6 kg)  Height:  4\' 9"  (1.448 m)   Translator called  FHR stable Cervix anterior lip and -1 per resident  Will continue ampicillin and laboring down until completely dilated Wants epidural Will try to get it placed   Aviva SignsWilliams, Vee Bahe L, CNM

## 2018-02-18 NOTE — Anesthesia Preprocedure Evaluation (Signed)
Anesthesia Evaluation  Patient identified by MRN, date of birth, ID band Patient awake    Reviewed: Allergy & Precautions, NPO status , Patient's Chart, lab work & pertinent test results  Airway Mallampati: II  TM Distance: >3 FB Neck ROM: Full    Dental  (+) Teeth Intact, Dental Advisory Given   Pulmonary neg pulmonary ROS,    Pulmonary exam normal breath sounds clear to auscultation       Cardiovascular negative cardio ROS Normal cardiovascular exam Rhythm:Regular Rate:Normal     Neuro/Psych negative neurological ROS     GI/Hepatic negative GI ROS, Neg liver ROS,   Endo/Other  negative endocrine ROSObesity   Renal/GU negative Renal ROS     Musculoskeletal negative musculoskeletal ROS (+)   Abdominal   Peds  Hematology negative hematology ROS (+) Plt 296k   Anesthesia Other Findings Day of surgery medications reviewed with the patient.  Reproductive/Obstetrics (+) Pregnancy                             Anesthesia Physical Anesthesia Plan  ASA: II  Anesthesia Plan: Epidural   Post-op Pain Management:    Induction:   PONV Risk Score and Plan: 2 and Treatment may vary due to age or medical condition  Airway Management Planned:   Additional Equipment:   Intra-op Plan:   Post-operative Plan:   Informed Consent: I have reviewed the patients History and Physical, chart, labs and discussed the procedure including the risks, benefits and alternatives for the proposed anesthesia with the patient or authorized representative who has indicated his/her understanding and acceptance.   Dental advisory given  Plan Discussed with:   Anesthesia Plan Comments: (Patient identified. Risks/Benefits/Options discussed with patient including but not limited to bleeding, infection, nerve damage, paralysis, failed block, incomplete pain control, headache, blood pressure changes, nausea, vomiting,  reactions to medication both or allergic, itching and postpartum back pain. Confirmed with bedside nurse the patient's most recent platelet count. Confirmed with patient that they are not currently taking any anticoagulation, have any bleeding history or any family history of bleeding disorders. Patient expressed understanding and wished to proceed. All questions were answered.   Spanish interpreter utilized throughout entire patient encounter.)        Anesthesia Quick Evaluation

## 2018-02-19 ENCOUNTER — Encounter (HOSPITAL_COMMUNITY): Payer: Self-pay | Admitting: *Deleted

## 2018-02-19 DIAGNOSIS — Z3A39 39 weeks gestation of pregnancy: Secondary | ICD-10-CM

## 2018-02-19 LAB — RPR: RPR Ser Ql: NONREACTIVE

## 2018-02-19 MED ORDER — ONDANSETRON HCL 4 MG PO TABS: 4.0000 mg | ORAL_TABLET | ORAL | Status: DC | PRN

## 2018-02-19 MED ORDER — DIPHENHYDRAMINE HCL 25 MG PO CAPS: 25.0000 mg | ORAL_CAPSULE | Freq: Four times a day (QID) | ORAL | Status: DC | PRN

## 2018-02-19 MED ORDER — SIMETHICONE 80 MG PO CHEW: 80.0000 mg | CHEWABLE_TABLET | ORAL | Status: DC | PRN

## 2018-02-19 MED ORDER — MAGNESIUM HYDROXIDE 400 MG/5ML PO SUSP: 30.0000 mL | ORAL | Status: DC | PRN

## 2018-02-19 MED ORDER — FERROUS SULFATE 325 (65 FE) MG PO TABS
325.0000 mg | ORAL_TABLET | Freq: Two times a day (BID) | ORAL | Status: DC
  Administered 2018-02-19 – 2018-02-20 (×3): 325 mg via ORAL
  Filled 2018-02-19 (×3): qty 1

## 2018-02-19 MED ORDER — MEASLES, MUMPS & RUBELLA VAC ~~LOC~~ INJ: 0.5000 mL | INJECTION | Freq: Once | SUBCUTANEOUS | Status: DC

## 2018-02-19 MED ORDER — ZOLPIDEM TARTRATE 5 MG PO TABS: 5.0000 mg | ORAL_TABLET | Freq: Every evening | ORAL | Status: DC | PRN

## 2018-02-19 MED ORDER — COCONUT OIL OIL: 1.0000 "application " | TOPICAL_OIL | Status: DC | PRN

## 2018-02-19 MED ORDER — PRENATAL MULTIVITAMIN CH
1.0000 | ORAL_TABLET | Freq: Every day | ORAL | Status: DC
  Administered 2018-02-19 – 2018-02-20 (×2): 1 via ORAL
  Filled 2018-02-19 (×2): qty 1

## 2018-02-19 MED ORDER — IBUPROFEN 600 MG PO TABS
600.0000 mg | ORAL_TABLET | Freq: Four times a day (QID) | ORAL | Status: DC
  Administered 2018-02-19 – 2018-02-20 (×7): 600 mg via ORAL
  Filled 2018-02-19 (×7): qty 1

## 2018-02-19 MED ORDER — TETANUS-DIPHTH-ACELL PERTUSSIS 5-2.5-18.5 LF-MCG/0.5 IM SUSP: 0.5000 mL | Freq: Once | INTRAMUSCULAR | Status: DC

## 2018-02-19 MED ORDER — BENZOCAINE-MENTHOL 20-0.5 % EX AERO: 1.0000 "application " | INHALATION_SPRAY | CUTANEOUS | Status: DC | PRN

## 2018-02-19 MED ORDER — ACETAMINOPHEN 325 MG PO TABS: 650.0000 mg | ORAL_TABLET | ORAL | Status: DC | PRN

## 2018-02-19 MED ORDER — ONDANSETRON HCL 4 MG/2ML IJ SOLN: 4.0000 mg | INTRAMUSCULAR | Status: DC | PRN

## 2018-02-19 MED ORDER — OXYCODONE-ACETAMINOPHEN 5-325 MG PO TABS: 2.0000 | ORAL_TABLET | ORAL | Status: DC | PRN

## 2018-02-19 MED ORDER — OXYCODONE-ACETAMINOPHEN 5-325 MG PO TABS: 1.0000 | ORAL_TABLET | ORAL | Status: DC | PRN

## 2018-02-19 MED ORDER — DIBUCAINE 1 % RE OINT: 1.0000 "application " | TOPICAL_OINTMENT | RECTAL | Status: DC | PRN

## 2018-02-19 MED ORDER — WITCH HAZEL-GLYCERIN EX PADS: 1.0000 "application " | MEDICATED_PAD | CUTANEOUS | Status: DC | PRN

## 2018-02-19 NOTE — Progress Notes (Signed)
Interpreter [phone] used for interpretation

## 2018-02-19 NOTE — Anesthesia Postprocedure Evaluation (Signed)
Anesthesia Post Note  Patient: Haley Lin  Procedure(s) Performed: AN AD HOC LABOR EPIDURAL     Patient location during evaluation: Mother Baby Anesthesia Type: Epidural Level of consciousness: awake and alert and oriented Pain management: satisfactory to patient Vital Signs Assessment: post-procedure vital signs reviewed and stable Respiratory status: respiratory function stable Cardiovascular status: stable Postop Assessment: no headache, no backache, epidural receding, patient able to bend at knees, no signs of nausea or vomiting and adequate PO intake Anesthetic complications: no    Last Vitals:  Vitals:   02/19/18 0310 02/19/18 0617  BP: 117/68 127/80  Pulse: 82 (!) 58  Resp: 16 18  Temp: 37 C 36.7 C  SpO2:      Last Pain:  Vitals:   02/19/18 0617  TempSrc: Oral  PainSc:    Pain Goal: Patients Stated Pain Goal: 5 (02/18/18 2045)               Karleen DolphinFUSSELL,Donnell Beauchamp

## 2018-02-19 NOTE — Lactation Note (Signed)
This note was copied from a baby's chart. Lactation Consultation Note  Patient Name: Haley Keturah BarreGloria Gabriel-Santiago LKGMW'NToday's Date: 02/19/2018 Reason for consult: Initial assessment Breastfeeding consultation services and support information given to patient.  This is mom's fourth baby and she breastfed her previous babies.  Newborn is 6710 hours old and has been to breast 4-5 times.  Mom reports baby is latching easily and feeding well.  Instructed to feed with any feeding cue and call for assist prn.  Maternal Data Does the patient have breastfeeding experience prior to this delivery?: Yes  Feeding Feeding Type: Breast Fed Length of feed: 0 min(baby gaggy and spitty   would not latch)  LATCH Score                   Interventions    Lactation Tools Discussed/Used     Consult Status Consult Status: Follow-up Date: 02/20/18 Follow-up type: In-patient    Huston FoleyMOULDEN, Jadynn Epping S 02/19/2018, 10:56 AM

## 2018-02-20 MED ORDER — IBUPROFEN 600 MG PO TABS: 600.0000 mg | ORAL_TABLET | Freq: Four times a day (QID) | ORAL | 0 refills | Status: DC

## 2018-02-20 NOTE — Progress Notes (Signed)
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  CSW notes that she received counseling and has delivered other children since.  No documentation of mental health concerns during pregnancy.  CSW notes Edinburgh Postnatal Depression Scale score of 8, however, she noted "sometimes" feel liking harming self on question 10.  Chaplain met with MOB who clarified that this is not something she is currently feeling after she was given the screen in [place].  CSW is screening out referral.  No barriers to discharge.

## 2018-02-20 NOTE — Discharge Instructions (Signed)
Parto vaginal, cuidados posteriores  Vaginal Delivery, Care After  Siga estas instrucciones durante las prximas semanas. Estas indicaciones le proporcionan informacin acerca de cmo deber cuidarse despus del parto vaginal. Su mdico tambin podr darle indicaciones ms especficas. El tratamiento ha sido planificado segn las prcticas mdicas actuales, pero en algunos casos pueden ocurrir problemas. Llame al mdico si tiene problemas o preguntas.  Qu puedo esperar despus del procedimiento?  Despus de un parto vaginal, es frecuente tener lo siguiente:   Hemorragia leve de la vagina.   Dolor en el abdomen, la vagina y la zona de la piel entre la abertura vaginal y el ano (perineo).   Calambres plvicos.   Fatiga.    Siga estas indicaciones en su casa:  Medicamentos   Tome los medicamentos de venta libre y los recetados solamente como se lo haya indicado el mdico.   Si le recetaron un antibitico, tmelo como se lo haya indicado el mdico. No interrumpa la administracin del antibitico hasta que lo haya terminado.  Conducir     No conduzca ni opere maquinaria pesada mientras toma analgsicos recetados.   No conduzca durante 24horas si le administraron un sedante.  Estilo de vida   No beba alcohol. Esto es de suma importancia si est amamantando o toma analgsicos.   No consuma productos que contengan tabaco, incluidos cigarrillos, tabaco de mascar o cigarrillos electrnicos. Si necesita ayuda para dejar de fumar, consulte al mdico.  Qu debe comer y beber   Beba al menos 8vasos de ochoonzas (240cc) de agua todos los das a menos que el mdico le indique lo contrario. Si elige amamantar al beb, quiz deba beber an ms cantidad de agua.   Coma alimentos ricos en fibras todos los das. Estos alimentos pueden ayudarla a prevenir o aliviar el estreimiento. Los alimentos ricos en fibras incluyen, entre otros:  ? Panes y cereales integrales.  ? Arroz integral.  ? Frijoles.  ? Frutas y verduras  frescas.  Actividad   Retome sus actividades normales como se lo haya indicado el mdico. Pregntele al mdico qu actividades son seguras para usted.   Descanse todo lo que pueda. Trate de descansar o tomar una siesta mientras el beb est durmiendo.   No levante objetos que pesen ms que su beb o 10libras (4,5kg) hasta que el mdico le diga que es seguro.   Hable con el mdico sobre cundo puede retomar la actividad sexual. Esto puede depender de lo siguiente:  ? Riesgo de sufrir una infeccin.  ? Velocidad de cicatrizacin.  ? Comodidad y deseo de retomar la actividad sexual.  Cuidados vaginales   Si le realizaron una episiotoma o tuvo un desgarro vaginal, contrlese la zona todos los das para detectar signos de infeccin. Est atenta a los siguientes signos:  ? Aumento del enrojecimiento, la hinchazn o el dolor.  ? Mayor presencia de lquido o sangre.  ? Calor.  ? Pus o mal olor.   No use tampones ni se haga duchas vaginales hasta que el mdico la autorice.   Controle la sangre que elimina por la vagina para detectar cogulos de sangre. Estos pueden tener el aspecto de grumos de color rojo oscuro, o secrecin marrn o negra.  Instrucciones generales   Mantenga el perineo limpio y seco, como se lo haya indicado el mdico.   Use ropa cmoda y suelta.   Cuando vaya al bao, siempre higiencese de adelante hacia atrs.   Pregntele al mdico si puede ducharse o tomar baos de inmersin.   Si se le realiz una episiotoma o tuvo un desgarro perineal durante el trabajo del parto o el parto, es posible que el mdico le indique que no tome baos de inmersin durante un determinado tiempo.   Use un sostn que sujete y ajuste bien sus pechos.   Si es posible, pdale a alguien que la ayude con las tareas del hogar y a cuidar del beb durante al menos algunos das despus de que le den el alta del hospital.   Concurra a todas las visitas de seguimiento para usted y el beb, como se lo haya indicado el  mdico. Esto es importante.  Comunquese con un mdico si:   Tiene los siguientes sntomas:  ? Secrecin vaginal que tiene mal olor.  ? Dificultad para orinar.  ? Dolor al orinar.  ? Aumento o disminucin repentinos de la frecuencia de las deposiciones.  ? Ms enrojecimiento, hinchazn o dolor alrededor de la episiotoma o del desgarro vaginal.  ? Ms secrecin de lquido o sangre de la episiotoma o del desgarro vaginal.  ? Pus o mal olor proveniente de la episiotoma o del desgarro vaginal.  ? Fiebre.  ? Erupcin cutnea.  ? Poco inters o falta de inters en actividades que solan gustarle.  ? Dudas sobre su cuidado y el del beb.   Siente la episiotoma o el desgarro vaginal caliente al tacto.   La episiotoma o el desgarro vaginal se abren o no parecen cicatrizar.   Siente dolor en las mamas, o estn duras o enrojecidas.   Siente tristeza o preocupacin de forma inusual.   Siente nuseas o vomita.   Elimina cogulos de sangre grandes por la vagina. Si expulsa un cogulo de sangre por la vagina, gurdelo para mostrrselo a su mdico. No tire la cadena sin que el mdico examine el cogulo de sangre antes.   Orina ms de lo habitual.   Se siente mareada o se desmaya.   No ha amamantado para nada y no ha tenido un perodo menstrual durante 12 semanas despus del parto.   Dej de amamantar al beb y no ha tenido su perodo menstrual durante 12 semanas despus de dejar de amamantar.  Solicite ayuda de inmediato si:   Tiene los siguientes sntomas:  ? Dolor que no desaparece o no mejora con medicamentos.  ? Dolor en el pecho.  ? Dificultad para respirar.  ? Visin borrosa o manchas en la vista.  ? Pensamientos de autolesionarse o lesionar al beb.   Comienza a sentir dolor en el abdomen o en una de las piernas.   Presenta un dolor de cabeza intenso.   Se desmaya.   Tiene una hemorragia de la vagina tan intensa que empapa dos toallitas sanitarias en una hora.  Esta informacin no tiene como fin  reemplazar el consejo del mdico. Asegrese de hacerle al mdico cualquier pregunta que tenga.  Document Released: 08/21/2005 Document Revised: 12/13/2016 Document Reviewed: 09/05/2015  Elsevier Interactive Patient Education  2018 Elsevier Inc.

## 2018-02-20 NOTE — Discharge Summary (Addendum)
OB Discharge Summary     Patient Name: Haley Lin DOB: May 08, 1986 MRN: [id number]  Date of admission: 02/18/2018 Delivering MD: Dorathy Kinsman   Date of discharge: 02/20/2018  Admitting diagnosis: 39WKS CTX Intrauterine pregnancy: [redacted]w[redacted]d     Secondary diagnosis:  Active Problems:   SVD (spontaneous vaginal delivery)   Normal labor  Additional problems: none     Discharge diagnosis: Term Pregnancy Delivered                                                                                                Post partum procedures:none  Augmentation: none  Complications: None  Hospital course:  Onset of Labor With Vaginal Delivery     32 y.o. yo [id number] at [redacted]w[redacted]d was admitted in Active Labor on 02/18/2018. Patient had an uncomplicated labor course as follows:  Membrane Rupture Time/Date: 3:00 AM ,02/18/2018   Intrapartum Procedures: Episiotomy: None [1]                                         Lacerations:  None [1]  Patient had a delivery of a Viable infant. 02/19/2018  Information for the patient's newborn:  Haley, Lin [[id number]]  Delivery Method: Vag-Spont    Pateint had an uncomplicated postpartum course.  She is ambulating, tolerating a regular diet, passing flatus, and urinating well. Patient is discharged home in stable condition on 02/20/18.   Physical exam  Vitals:   02/19/18 0940 02/19/18 1423 02/19/18 1502 02/20/18 0549  BP: 109/65 110/78 101/60 (!) 98/58  Pulse: 70 70 72 60  Resp: 18 18 17 18   Temp: 98.9 F (37.2 C) 98.6 F (37 C)  97.9 F (36.6 C)  TempSrc: Oral Oral  Oral  SpO2:      Weight:      Height:       General: alert, cooperative and no distress Lochia: appropriate Uterine Fundus: firm Incision: N/A DVT Evaluation: No evidence of DVT seen on physical exam. No cords or calf tenderness. No significant calf/ankle edema. Labs: Lab Results  Component Value Date   WBC 11.9 (H) 02/18/2018   HGB 12.1 02/18/2018   HCT  35.7 (L) 02/18/2018   MCV 85.2 02/18/2018   PLT 296 02/18/2018   CMP Latest Ref Rng & Units 03/30/2017  Glucose 65 - 99 mg/dL 696(E)  BUN 6 - 20 mg/dL 7  Creatinine 9.52 - 8.41 mg/dL 3.24  Sodium 401 - 027 mmol/L 135  Potassium 3.5 - 5.1 mmol/L 3.6  Chloride 101 - 111 mmol/L 105  CO2 22 - 32 mmol/L 21(L)  Calcium 8.9 - 10.3 mg/dL 2.5(D)  Total Protein 6.5 - 8.1 g/dL 7.1  Total Bilirubin 0.3 - 1.2 mg/dL 6.6(Y)  Alkaline Phos 38 - 126 U/L 98  AST 15 - 41 U/L 122(H)  ALT 14 - 54 U/L 159(H)    Discharge instruction: per After Visit Summary and "Baby and Me Booklet".  After visit meds:  Allergies as of 02/20/2018   No Known Allergies  Medication List    TAKE these medications   ibuprofen 600 MG tablet Commonly known as:  ADVIL,MOTRIN Take 1 tablet (600 mg total) by mouth every 6 (six) hours.   prenatal multivitamin Tabs tablet Take 1 tablet by mouth at bedtime.       Diet: routine diet  Activity: Advance as tolerated. Pelvic rest for 6 weeks.   Outpatient follow up:4 weeks Follow up Appt:No future appointments. Follow up Visit:No follow-ups on file.  Postpartum contraception: Condoms  Newborn Data: Live born female  Birth Weight: 7 lb 3.9 oz (3285 g) APGAR: 9, 9  Newborn Delivery   Birth date/time:  02/19/2018 00:17:00 Delivery type:  Vaginal, Spontaneous     Baby Feeding: Breast Disposition:home with mother   02/20/2018 Haley GageBrittany P Hipkins, MD  OB FELLOW DISCHARGE ATTESTATION  I have seen and examined this patient and agree with above documentation in the resident's note.   Frederik PearJulie P Haley Ivey, MD OB Fellow 02/20/2018

## 2018-02-20 NOTE — Progress Notes (Signed)
I spoke with pt and FOB regarding a history of PPD and about her [place] score of 1 related to harming herself.  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  She is aware of the symptoms and knows to call her provider if she has any signs.  She [place] again at the [place] and stated that she does not have any intent or thoughts to harm herself.  I relayed all of this information to her nurse and to the social worker, Lulu RidingColleen Shaw.    Chaplain Dyanne CarrelKaty Zigmund Linse, Bcc Pager, [phone] 4:06 PM    02/20/18 1500  Clinical Encounter Type  Visited With Patient and family together  Visit Type Spiritual support  Referral From Social work

## 2019-09-05 NOTE — L&D Delivery Note (Addendum)
OB/GYN Faculty Practice Delivery Note  Haley Lin is a 34 y.o. [id number] s/p SVD at [redacted]w[redacted]d. She was admitted for SOL.   ROM: 0h 37m with clear fluid GBS Status: negative   Maximum Maternal Temperature unknown due to rapid nature of labor  Labor Progress: Patient presented to L&D for SOL. Initial SVE: 10/100%/ bulging bag around +2. Labor course was uncomplicated. She then progressed to complete.   Delivery Date/Time: 8:00 Delivery: Called to room and patient was complete and pushing. Head position was ROA and delivered with ease over the perineum. No nuchal cord present. Shoulder and body delivered in usual fashion. Infant with spontaneous cry, placed on mother's abdomen, dried and stimulated. Cord clamped x 2 after 1-minute delay, and cut by support person. Cord blood drawn. Placenta delivered spontaneously with gentle cord traction. Fundus firm with massage and pitocin started. Labia, perineum, vagina, and cervix inspected and were insignificant.  Baby Weight: 6lbs 10oz  Cord: central insertion, 3 vessel Placenta: Sent to L&D Complications: None Lacerations: none EBL: 100cc Analgesia: none   Infant: APGAR (1 MIN): 9   APGAR (5 MINS): 9    Kathrin Greathouse, MD, PGY-1 OBGYN Faculty Teaching Service  07/05/2020, 8:28 AM  Attestation of Supervision of Student:  I confirm that I have verified the information documented in the medical student's note and that I was present and gloved for the entirety of the delivery.  Bernerd Limbo, CNM [facility] [employer], [facility] 07/05/2020 9:20 PM

## 2019-12-24 ENCOUNTER — Inpatient Hospital Stay (HOSPITAL_COMMUNITY): Payer: Self-pay

## 2019-12-24 ENCOUNTER — Inpatient Hospital Stay (HOSPITAL_COMMUNITY)
Admission: AD | Admit: 2019-12-24 | Discharge: 2019-12-24 | Disposition: A | Discharge status: Home / Self Care | Payer: Self-pay | Attending: Family Medicine | Admitting: Family Medicine

## 2019-12-24 ENCOUNTER — Other Ambulatory Visit: Payer: Self-pay

## 2019-12-24 ENCOUNTER — Encounter (HOSPITAL_COMMUNITY): Payer: Self-pay | Admitting: Family Medicine

## 2019-12-24 DIAGNOSIS — R1030 Lower abdominal pain, unspecified: Secondary | ICD-10-CM | POA: Insufficient documentation

## 2019-12-24 DIAGNOSIS — O26891 Other specified pregnancy related conditions, first trimester: Secondary | ICD-10-CM | POA: Insufficient documentation

## 2019-12-24 DIAGNOSIS — R109 Unspecified abdominal pain: Secondary | ICD-10-CM

## 2019-12-24 DIAGNOSIS — Z3A10 10 weeks gestation of pregnancy: Secondary | ICD-10-CM | POA: Insufficient documentation

## 2019-12-24 DIAGNOSIS — Z3491 Encounter for supervision of normal pregnancy, unspecified, first trimester: Secondary | ICD-10-CM

## 2019-12-24 HISTORY — DX: Anemia, unspecified: D64.9

## 2019-12-24 LAB — WET PREP, GENITAL
Clue Cells Wet Prep HPF POC: NONE SEEN
Sperm: NONE SEEN
Trich, Wet Prep: NONE SEEN
Yeast Wet Prep HPF POC: NONE SEEN

## 2019-12-24 LAB — CBC
HCT: 36.4 % (ref 36.0–46.0)
Hemoglobin: 12.3 g/dL (ref 12.0–15.0)
MCH: 28.6 pg (ref 26.0–34.0)
MCHC: 33.8 g/dL (ref 30.0–36.0)
MCV: 84.7 fL (ref 80.0–100.0)
Platelets: 329 10*3/uL (ref 150–400)
RBC: 4.3 MIL/uL (ref 3.87–5.11)
RDW: 11.9 % (ref 11.5–15.5)
WBC: 7.9 10*3/uL (ref 4.0–10.5)
nRBC: 0 % (ref 0.0–0.2)

## 2019-12-24 LAB — ABO/RH: ABO/RH(D): O POS

## 2019-12-24 LAB — POCT PREGNANCY, URINE: Preg Test, Ur: POSITIVE — AB

## 2019-12-24 LAB — URINALYSIS, ROUTINE W REFLEX MICROSCOPIC
Bilirubin Urine: NEGATIVE
Glucose, UA: NEGATIVE mg/dL
Hgb urine dipstick: NEGATIVE
Ketones, ur: NEGATIVE mg/dL
Leukocytes,Ua: NEGATIVE
Nitrite: NEGATIVE
Protein, ur: NEGATIVE mg/dL
Specific Gravity, Urine: 1.01 (ref 1.005–1.030)
pH: 6 (ref 5.0–8.0)

## 2019-12-24 LAB — HIV ANTIBODY (ROUTINE TESTING W REFLEX): HIV Screen 4th Generation wRfx: NONREACTIVE

## 2019-12-24 LAB — HCG, QUANTITATIVE, PREGNANCY: hCG, Beta Chain, Quant, S: 116690 m[IU]/mL — ABNORMAL HIGH (ref <5)

## 2019-12-24 NOTE — Progress Notes (Signed)
GC/Chlamydia cultures & wet prep done by Ivonne Andrew, CNM.

## 2019-12-24 NOTE — MAU Note (Signed)
About 2 wks ago, started having light abd pain.  This morning she was awakened by very severe abd pain that runs to her back.  Denies bleeding or d/c. +HPT

## 2019-12-24 NOTE — Discharge Instructions (Signed)
Dolor abdominal durante el embarazo °Abdominal Pain During Pregnancy ° °El dolor abdominal es común durante el embarazo y tiene muchas causas posibles. Algunas causas son más graves que otras, y a veces la causa se desconoce. El dolor abdominal puede ser un indicio de que está comenzando el parto. También puede ser ocasionado por el crecimiento y estiramiento de los músculos y ligamentos durante el embarazo. Siempre informe a su médico si siente dolor abdominal. °Siga estas indicaciones en su casa: °· No tenga relaciones sexuales ni se coloque nada dentro de la vagina hasta que el dolor haya desaparecido completamente. °· Descanse todo lo que pueda hasta que el dolor se le haya calmado. °· Beba suficiente líquido para mantener la orina de color amarillo pálido. °· Tome los medicamentos de venta libre y los recetados solamente como se lo haya indicado el médico. °· Concurra a todas las visitas de control como se lo haya indicado el médico. Esto es importante. °Comuníquese con un médico si: °· El dolor continúa o empeora después de descansar. °· Siente dolor en la parte inferior del abdomen que: °? Va y viene en intervalos regulares. °? Se extiende a la espalda. °? Es parecido a los cólicos menstruales. °· Siente dolor o ardor al orinar. °Solicite ayuda de inmediato si: °· Tiene fiebre o siente escalofríos. °· Tiene una hemorragia vaginal abundante. °· Tiene una pérdida de líquido por la vagina. °· Elimina tejidos por la vagina. °· Vomita o tiene diarrea durante más de 24 horas. °· El bebé se mueve menos de lo habitual. °· Se siente débil o se desmaya. °· Le falta el aire. °· Siente dolor intenso en la parte superior del abdomen. °Resumen °· El dolor abdominal es común durante el embarazo y tiene muchas causas posibles. °· Si siente dolor abdominal durante el embarazo, informe al médico de inmediato. °· Siga las indicaciones del médico para el cuidado en el hogar y concurra a todas las visitas de control como se lo  hayan indicado. °Esta información no tiene como fin reemplazar el consejo del médico. Asegúrese de hacerle al médico cualquier pregunta que tenga. °Document Revised: 02/13/2017 Document Reviewed: 02/13/2017 °Elsevier Patient Education © 2020 Elsevier Inc. ° °

## 2019-12-24 NOTE — MAU Provider Note (Signed)
Chief Complaint: Abdominal Pain and Possible Pregnancy   First Provider Initiated Contact with Patient 12/24/19 1146     SUBJECTIVE HPI: Haley Lin is a 34 y.o. [id number] at [redacted]w[redacted]d who presents to Maternity Admissions reporting low abdominal pain times a few weeks that got much worse this morning.  Vaginal Bleeding: Denies Passage of tissue or clots: Denies Dizziness: Denies  O POS  Pain Location: Bilateral lower abdomen Quality: Cramping.  Feels like she has to have a bowel movement. Severity: 3-8/10 on pain scale Duration: Few weeks Course: Worsening Context: Early pregnancy.  IUP not confirmed Timing: Intermittent Modifying factors: Nothing.  Has not tried anything for the pain. Associated signs and symptoms: Positive for chronic loose stools since having Covid last fall.  No recent changes.  Negative for fever, chills, urinary complaints, nausea, vomiting, vaginal bleeding or vaginal discharge.  Past Medical History:  Diagnosis Date  . Anemia    with pregnancy  . Medical history non-contributory    OB History  Gravida Para Term Preterm AB Living  5 4 4  0 0 4  SAB TAB Ectopic Multiple Live Births  0 0 0 0 4    # Outcome Date GA Lbr Len/2nd Weight Sex Delivery Anes PTL Lv  5 Current           4 Term 02/19/18 [redacted]w[redacted]d  3285 g M Vag-Spont EPI  LIV  3 Term 05/15/16 [redacted]w[redacted]d 11:16 / 00:41 3181 g M Vag-Spont None  LIV  2 Term 10/25/12 [redacted]w[redacted]d 03:18 / 00:08 3475 g M Vag-Spont None  LIV  1 Term 09/25/08    M Vag-Spont   LIV   Past Surgical History:  Procedure Laterality Date  . CHOLECYSTECTOMY N/A 03/29/2017   Procedure: LAPAROSCOPIC CHOLECYSTECTOMY;  Surgeon: Clovis Riley, MD;  Location: [place];  Service: General;  Laterality: N/A;  . LAPAROSCOPIC CHOLECYSTECTOMY  03/29/2017   Social History   Socioeconomic History  . Marital status: Married    Spouse name: Not on file  . Number of children: Not on file  . Years of education: Not on file  . Highest education  level: Not on file  Occupational History  . Not on file  Tobacco Use  . Smoking status: Never Smoker  . Smokeless tobacco: Never Used  Substance and Sexual Activity  . Alcohol use: No  . Drug use: No  . Sexual activity: Not Currently    Birth control/protection: None  Other Topics Concern  . Not on file  Social History Narrative  . Not on file   Social Determinants of Health   Financial Resource Strain:   . Difficulty of Paying Living Expenses:   Food Insecurity:   . Worried About Charity fundraiser in the Last Year:   . Arboriculturist in the Last Year:   Transportation Needs:   . Film/video editor (Medical):   Marland Kitchen Lack of Transportation (Non-Medical):   Physical Activity:   . Days of Exercise per Week:   . Minutes of Exercise per Session:   Stress:   . Feeling of Stress :   Social Connections:   . Frequency of Communication with Friends and Family:   . Frequency of Social Gatherings with Friends and Family:   . Attends Religious Services:   . Active Member of Clubs or Organizations:   . Attends Archivist Meetings:   Marland Kitchen Marital Status:   Intimate Partner Violence:   . Fear of Current or Ex-Partner:   . Emotionally  Abused:   Marland Kitchen Physically Abused:   . Sexually Abused:    No current facility-administered medications on file prior to encounter.   Current Outpatient Medications on File Prior to Encounter  Medication Sig Dispense Refill  . Prenatal Vit-Fe Fumarate-FA (PRENATAL MULTIVITAMIN) TABS Take 1 tablet by mouth at bedtime.     Marland Kitchen ibuprofen (ADVIL,MOTRIN) 600 MG tablet Take 1 tablet (600 mg total) by mouth every 6 (six) hours. 30 tablet 0   No Known Allergies  I have reviewed the past Medical Hx, Surgical Hx, Social Hx, Allergies and Medications.   Review of Systems  Constitutional: Negative for appetite change, chills and fever.  Gastrointestinal: Positive for abdominal pain and diarrhea. Negative for constipation, nausea and vomiting.   Genitourinary: Negative for difficulty urinating, dysuria, flank pain, frequency, hematuria, pelvic pain, urgency, vaginal bleeding and vaginal discharge.  Musculoskeletal: Positive for back pain.    OBJECTIVE Patient Vitals for the past 24 hrs:  BP Temp Temp src Pulse Resp SpO2 Weight  12/24/19 1109 114/67 98.2 F (36.8 C) Oral 93 16 100 % 62.3 kg   Constitutional: Well-developed, well-nourished female in no acute distress.  Cardiovascular: normal rate Respiratory: normal rate and effort.  GI: Abd soft, mild abdominal tenderness.  No guarding or rebound tenderness. Pos BS x 4 MS: Extremities nontender, no edema, normal ROM Neurologic: Alert and oriented x 4.  GU: Neg CVAT.  SPECULUM EXAM: NEFG, physiologic discharge, no blood noted, cervix closed uterus slightly enlarged but exam limited by angle of uterus, no adnexal tenderness or masses.  No CMT.  LAB RESULTS Results for orders placed or performed during the hospital encounter of 12/24/19 (from the past 24 hour(s))  Urinalysis, Routine w reflex microscopic     Status: Abnormal   Collection Time: 12/24/19 11:16 AM  Result Value Ref Range   Color, Urine YELLOW YELLOW   APPearance HAZY (A) CLEAR   Specific Gravity, Urine 1.010 1.005 - 1.030   pH 6.0 5.0 - 8.0   Glucose, UA NEGATIVE NEGATIVE mg/dL   Hgb urine dipstick NEGATIVE NEGATIVE   Bilirubin Urine NEGATIVE NEGATIVE   Ketones, ur NEGATIVE NEGATIVE mg/dL   Protein, ur NEGATIVE NEGATIVE mg/dL   Nitrite NEGATIVE NEGATIVE   Leukocytes,Ua NEGATIVE NEGATIVE  Pregnancy, urine POC     Status: Abnormal   Collection Time: 12/24/19 11:17 AM  Result Value Ref Range   Preg Test, Ur POSITIVE (A) NEGATIVE  CBC     Status: None   Collection Time: 12/24/19 12:01 PM  Result Value Ref Range   WBC 7.9 4.0 - 10.5 K/uL   RBC 4.30 3.87 - 5.11 MIL/uL   Hemoglobin 12.3 12.0 - 15.0 g/dL   HCT 30.0 76.2 - 26.3 %   MCV 84.7 80.0 - 100.0 fL   MCH 28.6 26.0 - 34.0 pg   MCHC 33.8 30.0 - 36.0  g/dL   RDW 33.5 45.6 - 25.6 %   Platelets 329 150 - 400 K/uL   nRBC 0.0 0.0 - 0.2 %  Wet prep, genital     Status: Abnormal   Collection Time: 12/24/19 12:01 PM   Specimen: Vaginal  Result Value Ref Range   Yeast Wet Prep HPF POC NONE SEEN NONE SEEN   Trich, Wet Prep NONE SEEN NONE SEEN   Clue Cells Wet Prep HPF POC NONE SEEN NONE SEEN   WBC, Wet Prep HPF POC MODERATE (A) NONE SEEN   Sperm NONE SEEN   ABO/Rh     Status: None  Collection Time: 12/24/19 12:01 PM  Result Value Ref Range   ABO/RH(D) O POS    No rh immune globuloin      NOT A RH IMMUNE GLOBULIN CANDIDATE, PT RH POSITIVE Performed at [facility], [address], [place], [place] [address]     IMAGING [place] Comp Less 14 Wks  Result Date: 12/24/2019 CLINICAL DATA:  Abdominal pain today. Pregnant patient. Patient is 10 weeks and 4 days pregnant based on her last menstrual period. EXAM: OBSTETRIC <14 WK ULTRASOUND TECHNIQUE: Transabdominal ultrasound was performed for evaluation of the gestation as well as the maternal uterus and adnexal regions. COMPARISON:  None. FINDINGS: Intrauterine gestational sac: Single Yolk sac:  Visualized. Embryo:  Visualized. Cardiac Activity: Visualized. Heart Rate: 165 bpm CRL:   38.76 mm   10 w 5 d                  [place] EDC: 07/17/2019 Subchorionic hemorrhage:  None visualized. Maternal uterus/adnexae: No uterine masses. Cervix is closed. Normal ovaries and adnexa. No pelvic free fluid. IMPRESSION: 1. Single live intrauterine pregnancy with a measured gestational age of [redacted] weeks and 4 days, consistent with the expected gestational age based on the last menstrual period. No pregnancy complication. Electronically Signed   By: Amie Portland M.D.   On: 12/24/2019 12:50    MAU COURSE CBC, Quant, ABO/Rh, ultrasound, wet prep and GC/chlamydia culture, UA  MDM Pain in early pregnancy with normal intrauterine pregnancy and hemodynamically stable.  ASSESSMENT 1. Normal IUP (intrauterine  pregnancy) on prenatal ultrasound, first trimester   2. Abdominal pain during pregnancy in first trimester     PLAN Discharge home in stable condition. First trimester precautions Comfort measures Follow-up Information    Department, [facility] Follow up.   Why: As scheduled for prenatal care Contact information: [address] [place] [address] [phone]        [facility] Follow up.   Specialty: Obstetrics and Gynecology Why: As needed in pregnancy emergencies Contact information: [address] [id number] [place] [address] [phone]         Allergies as of 12/24/2019   No Known Allergies     Medication List    STOP taking these medications   ibuprofen 600 MG tablet Commonly known as: ADVIL     TAKE these medications   prenatal multivitamin Tabs tablet Take 1 tablet by mouth at bedtime.        Katrinka Blazing, [place], CNM 12/24/2019  1:21 PM  4

## 2019-12-25 LAB — GC/CHLAMYDIA PROBE AMP (~~LOC~~) NOT AT ARMC
Chlamydia: NEGATIVE
Comment: NEGATIVE
Comment: NORMAL
Neisseria Gonorrhea: NEGATIVE

## 2020-01-26 LAB — OB RESULTS CONSOLE RUBELLA ANTIBODY, IGM: Rubella: IMMUNE

## 2020-01-26 LAB — OB RESULTS CONSOLE HEPATITIS B SURFACE ANTIGEN: Hepatitis B Surface Ag: NEGATIVE

## 2020-07-05 ENCOUNTER — Other Ambulatory Visit: Payer: Self-pay

## 2020-07-05 ENCOUNTER — Inpatient Hospital Stay (HOSPITAL_COMMUNITY)
Admission: AD | Admit: 2020-07-05 | Discharge: 2020-07-06 | DRG: 807 | Disposition: A | Discharge status: Home / Self Care | Payer: Medicaid Other | Attending: Obstetrics and Gynecology | Admitting: Obstetrics and Gynecology

## 2020-07-05 ENCOUNTER — Encounter (HOSPITAL_COMMUNITY): Payer: Self-pay | Admitting: Obstetrics and Gynecology

## 2020-07-05 DIAGNOSIS — O9902 Anemia complicating childbirth: Secondary | ICD-10-CM | POA: Diagnosis present

## 2020-07-05 DIAGNOSIS — Z20822 Contact with and (suspected) exposure to covid-19: Secondary | ICD-10-CM | POA: Diagnosis present

## 2020-07-05 DIAGNOSIS — D649 Anemia, unspecified: Secondary | ICD-10-CM | POA: Diagnosis present

## 2020-07-05 DIAGNOSIS — O26893 Other specified pregnancy related conditions, third trimester: Secondary | ICD-10-CM | POA: Diagnosis present

## 2020-07-05 DIAGNOSIS — Z3A38 38 weeks gestation of pregnancy: Secondary | ICD-10-CM | POA: Diagnosis not present

## 2020-07-05 LAB — COMPREHENSIVE METABOLIC PANEL
ALT: 61 U/L — ABNORMAL HIGH (ref 0–44)
AST: 42 U/L — ABNORMAL HIGH (ref 15–41)
Albumin: 2.7 g/dL — ABNORMAL LOW (ref 3.5–5.0)
Alkaline Phosphatase: 288 U/L — ABNORMAL HIGH (ref 38–126)
Anion gap: 12 (ref 5–15)
BUN: 5 mg/dL — ABNORMAL LOW (ref 6–20)
CO2: 17 mmol/L — ABNORMAL LOW (ref 22–32)
Calcium: 8.9 mg/dL (ref 8.9–10.3)
Chloride: 107 mmol/L (ref 98–111)
Creatinine, Ser: 0.52 mg/dL (ref 0.44–1.00)
GFR, Estimated: >60 mL/min (ref >60)
Glucose, Bld: 85 mg/dL (ref 70–99)
Potassium: 4 mmol/L (ref 3.5–5.1)
Sodium: 136 mmol/L (ref 135–145)
Total Bilirubin: 1.1 mg/dL (ref 0.3–1.2)
Total Protein: 7.4 g/dL (ref 6.5–8.1)

## 2020-07-05 LAB — PROTEIN / CREATININE RATIO, URINE
Creatinine, Urine: 29.04 mg/dL
Protein Creatinine Ratio: 0.45 mg/mg{Cre} — ABNORMAL HIGH (ref 0.00–0.15)
Total Protein, Urine: 13 mg/dL

## 2020-07-05 LAB — CBC
HCT: 40 % (ref 36.0–46.0)
Hemoglobin: 13.1 g/dL (ref 12.0–15.0)
MCH: 28.5 pg (ref 26.0–34.0)
MCHC: 32.8 g/dL (ref 30.0–36.0)
MCV: 87.1 fL (ref 80.0–100.0)
Platelets: 267 10*3/uL (ref 150–400)
RBC: 4.59 MIL/uL (ref 3.87–5.11)
RDW: 13.4 % (ref 11.5–15.5)
WBC: 8.7 10*3/uL (ref 4.0–10.5)
nRBC: 0 % (ref 0.0–0.2)

## 2020-07-05 LAB — RESPIRATORY PANEL BY RT PCR (FLU A&B, COVID)
Influenza A by PCR: NEGATIVE
Influenza B by PCR: NEGATIVE
SARS Coronavirus 2 by RT PCR: NEGATIVE

## 2020-07-05 LAB — RPR
RPR Ser Ql: REACTIVE — AB
RPR Titer: 1:1 {titer}

## 2020-07-05 LAB — TYPE AND SCREEN
ABO/RH(D): O POS
Antibody Screen: NEGATIVE

## 2020-07-05 MED ORDER — DIPHENHYDRAMINE HCL 25 MG PO CAPS: 25.0000 mg | ORAL_CAPSULE | Freq: Four times a day (QID) | ORAL | Status: DC | PRN

## 2020-07-05 MED ORDER — FLEET ENEMA 7-19 GM/118ML RE ENEM: 1.0000 | ENEMA | RECTAL | Status: DC | PRN

## 2020-07-05 MED ORDER — ONDANSETRON HCL 4 MG/2ML IJ SOLN: 4.0000 mg | INTRAMUSCULAR | Status: DC | PRN

## 2020-07-05 MED ORDER — OXYTOCIN BOLUS FROM INFUSION
333.0000 mL | Freq: Once | INTRAVENOUS | Status: AC
  Administered 2020-07-05: 333 mL via INTRAVENOUS

## 2020-07-05 MED ORDER — SOD CITRATE-CITRIC ACID 500-334 MG/5ML PO SOLN: 30.0000 mL | ORAL | Status: DC | PRN

## 2020-07-05 MED ORDER — OXYCODONE-ACETAMINOPHEN 5-325 MG PO TABS: 1.0000 | ORAL_TABLET | ORAL | Status: DC | PRN

## 2020-07-05 MED ORDER — OXYTOCIN 10 UNIT/ML IJ SOLN
INTRAMUSCULAR | Status: AC
  Filled 2020-07-05: qty 1

## 2020-07-05 MED ORDER — ACETAMINOPHEN 325 MG PO TABS
650.0000 mg | ORAL_TABLET | ORAL | Status: DC | PRN
  Administered 2020-07-05: 650 mg via ORAL
  Filled 2020-07-05: qty 2

## 2020-07-05 MED ORDER — LACTATED RINGERS IV SOLN: INTRAVENOUS | Status: DC

## 2020-07-05 MED ORDER — OXYTOCIN-SODIUM CHLORIDE 30-0.9 UT/500ML-% IV SOLN: 2.5000 [IU]/h | INTRAVENOUS | Status: DC

## 2020-07-05 MED ORDER — COCONUT OIL OIL
1.0000 "application " | TOPICAL_OIL | Status: DC | PRN
  Administered 2020-07-06: 1 via TOPICAL

## 2020-07-05 MED ORDER — OXYCODONE-ACETAMINOPHEN 5-325 MG PO TABS: 2.0000 | ORAL_TABLET | ORAL | Status: DC | PRN

## 2020-07-05 MED ORDER — OXYTOCIN-SODIUM CHLORIDE 30-0.9 UT/500ML-% IV SOLN
INTRAVENOUS | Status: AC
  Filled 2020-07-05: qty 500

## 2020-07-05 MED ORDER — WITCH HAZEL-GLYCERIN EX PADS: 1.0000 "application " | MEDICATED_PAD | CUTANEOUS | Status: DC | PRN

## 2020-07-05 MED ORDER — ONDANSETRON HCL 4 MG PO TABS: 4.0000 mg | ORAL_TABLET | ORAL | Status: DC | PRN

## 2020-07-05 MED ORDER — LACTATED RINGERS IV SOLN: 500.0000 mL | INTRAVENOUS | Status: DC | PRN

## 2020-07-05 MED ORDER — IBUPROFEN 600 MG PO TABS
600.0000 mg | ORAL_TABLET | Freq: Four times a day (QID) | ORAL | Status: DC
  Administered 2020-07-05 – 2020-07-06 (×5): 600 mg via ORAL
  Filled 2020-07-05 (×5): qty 1

## 2020-07-05 MED ORDER — ACETAMINOPHEN 325 MG PO TABS: 650.0000 mg | ORAL_TABLET | ORAL | Status: DC | PRN

## 2020-07-05 MED ORDER — LIDOCAINE HCL (PF) 1 % IJ SOLN: 30.0000 mL | INTRAMUSCULAR | Status: DC | PRN

## 2020-07-05 MED ORDER — SENNOSIDES-DOCUSATE SODIUM 8.6-50 MG PO TABS
2.0000 | ORAL_TABLET | ORAL | Status: DC
  Administered 2020-07-05: 2 via ORAL
  Filled 2020-07-05: qty 2

## 2020-07-05 MED ORDER — ONDANSETRON HCL 4 MG/2ML IJ SOLN: 4.0000 mg | Freq: Four times a day (QID) | INTRAMUSCULAR | Status: DC | PRN

## 2020-07-05 MED ORDER — OXYTOCIN BOLUS FROM INFUSION: 333.0000 mL | Freq: Once | INTRAVENOUS | Status: DC

## 2020-07-05 MED ORDER — SIMETHICONE 80 MG PO CHEW: 80.0000 mg | CHEWABLE_TABLET | ORAL | Status: DC | PRN

## 2020-07-05 MED ORDER — BENZOCAINE-MENTHOL 20-0.5 % EX AERO: 1.0000 "application " | INHALATION_SPRAY | CUTANEOUS | Status: DC | PRN

## 2020-07-05 MED ORDER — DIBUCAINE (PERIANAL) 1 % EX OINT: 1.0000 "application " | TOPICAL_OINTMENT | CUTANEOUS | Status: DC | PRN

## 2020-07-05 MED ORDER — PRENATAL MULTIVITAMIN CH
1.0000 | ORAL_TABLET | Freq: Every day | ORAL | Status: DC
  Administered 2020-07-05 – 2020-07-06 (×2): 1 via ORAL
  Filled 2020-07-05 (×2): qty 1
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
## 2020-07-05 NOTE — Progress Notes (Signed)
I was present during Delivery with MD Benita Gutter and Pauletta Browns, ordered pt meals, by Orlan Leavens Spanish Medical Interpreter.
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
## 2020-07-05 NOTE — MAU Note (Signed)
..  Brian Kocourek is a 34 y.o. at [redacted]w[redacted]d here in MAU reporting: contractions since last night at 10pm that have gotten worse and are now ever 2-3 minutes. Reports bloody show and denies LOF. +FM. GBS negative.   Pain score: 10/10 There were no vitals filed for this visit.   ZOX:[id number] applied 135 Lab orders placed from triage: MAU labor evaluation standing orders

## 2020-07-05 NOTE — Discharge Summary (Addendum)
Postpartum Discharge Summary  Date of Service updated 07/06/20      Patient Name: Haley Lin DOB: [birth year] MRN: [id number]  Date of admission: 07/05/2020 Delivery date:07/05/2020  Delivering provider: Gaylan Gerold R  Date of discharge: 07/06/2020  Admitting diagnosis: Indication for care in labor or delivery [O75.9] Normal labor [O80, Z37.9] Intrauterine pregnancy: [redacted]w[redacted]d    Secondary diagnosis:  Active Problems:   Normal labor   Indication for care in labor or delivery  Additional problems: None    Discharge diagnosis: Term Pregnancy Delivered                                              Post partum procedures:None Augmentation: AROM Complications: None  Hospital course: Onset of Labor With Vaginal Delivery      34y.o. yo [id number] admitted in Active Labor on 07/05/2020. Patient had an uncomplicated labor course as follows:  Membrane Rupture Time/Date: 7:17 AM ,07/05/2020   Delivery Method:Vaginal, Spontaneous  Episiotomy: None  Lacerations:  None  Patient had an uncomplicated postpartum course.  She is ambulating, tolerating a regular diet, passing flatus, and urinating well. Patient is discharged home in stable condition on 07/06/20.  Newborn Data: Birth date:07/05/2020  Birth time:8:00 AM  Gender:Female  Living status:Living  Apgars:9 ,9  Weight:3005 g   Magnesium Sulfate received: No BMZ received: No Rhophylac:N/A MMR:N/A T-DaP:Given prenatally Flu: N/A Transfusion:No  Physical exam  Vitals:   07/05/20 1530 07/05/20 1927 07/05/20 2348 07/06/20 0332  BP: 114/65 120/81 116/71 (!) 102/58  Pulse: 67 63 66 63  Resp: 16 16 15 16   Temp: 99.5 F (37.5 C) 99 F (37.2 C) 99 F (37.2 C) 98.8 F (37.1 C)  TempSrc: Oral Oral Oral Oral  SpO2:  98% 99% 99%  Weight:      Height:       General: alert and no distress Lochia: appropriate Uterine Fundus: firm Incision: N/A DVT Evaluation: No evidence of DVT seen on physical exam. No  cords or calf tenderness. No significant calf/ankle edema. Labs: Lab Results  Component Value Date   WBC 8.7 07/06/2020   HGB 11.4 (L) 07/06/2020   HCT 35.0 (L) 07/06/2020   MCV 86.8 07/06/2020   PLT 210 07/06/2020   CMP Latest Ref Rng & Units 07/05/2020  Glucose 70 - 99 mg/dL 85  BUN 6 - 20 mg/dL 5(L)  Creatinine 0.44 - 1.00 mg/dL 0.52  Sodium 135 - 145 mmol/L 136  Potassium 3.5 - 5.1 mmol/L 4.0  Chloride 98 - 111 mmol/L 107  CO2 22 - 32 mmol/L 17(L)  Calcium 8.9 - 10.3 mg/dL 8.9  Total Protein 6.5 - 8.1 g/dL 7.4  Total Bilirubin 0.3 - 1.2 mg/dL 1.1  Alkaline Phos 38 - 126 U/L 288(H)  AST 15 - 41 U/L 42(H)  ALT 0 - 44 U/L 61(H)   EdFlavia Shippercore: Edinburgh Postnatal Depression Scale Screening Tool 02/19/2018  I have been able to laugh and see the funny side of things. 0  I have looked forward with enjoyment to things. 0  I have blamed myself unnecessarily when things went wrong. 1  I have been anxious or worried for no good reason. 2  I have felt scared or panicky for no good reason. 1  Things have been getting on top of me. 2  I have been so unhappy that I have  had difficulty sleeping. 0  I have felt sad or miserable. 1  I have been so unhappy that I have been crying. 0  The thought of harming myself has occurred to me. 1  Edinburgh Postnatal Depression Scale Total 8   After visit meds:  Allergies as of 07/06/2020   No Known Allergies     Medication List    TAKE these medications   acetaminophen 325 MG tablet Commonly known as: Tylenol Take 2 tablets (650 mg total) by mouth every 4 (four) hours as needed (for pain scale < 4).   ibuprofen 600 MG tablet Commonly known as: ADVIL Take 1 tablet (600 mg total) by mouth every 6 (six) hours.   prenatal multivitamin Tabs tablet Take 1 tablet by mouth at bedtime.      Discharge home in stable condition Infant Feeding: Breast Infant Disposition:home with mother Discharge instruction: per After Visit Summary and  Postpartum booklet. Activity: Advance as tolerated. Pelvic rest for 6 weeks.  Diet: routine diet Future Appointments:No future appointments. Follow up Visit:  Please schedule this patient for a In person postpartum visit in 6 weeks with the following provider: Any provider. Additional Postpartum F/U:None  Low risk pregnancy complicated by: Nothing Delivery mode:  Vaginal, Spontaneous  Anticipated Birth Control:  Condoms  Midwife attestation I have seen and examined this patient and agree with above documentation in the rsident's note.   Mahayla Haddaway is a 34 y.o. [id number] s/p SVD.  Pain is well controlled. Plan for birth control is condoms. Method of Feeding: breast  PE:  Gen: well appearing Heart: reg rate Lungs: normal WOB Fundus firm Ext: no pain, no edema  Recent Labs    07/05/20 0733 07/06/20 0609  HGB 13.1 11.4*  HCT 40.0 35.0*    Assessment S/p SVD PPD # 1  Plan: - discharge home - postpartum care discussed - f/u at [facility] in 6 weeks for postpartum visit   Julianne Handler, CNM 11:23 AM

## 2020-07-06 ENCOUNTER — Other Ambulatory Visit (HOSPITAL_COMMUNITY): Payer: Self-pay | Admitting: Student in an Organized Health Care Education/Training Program

## 2020-07-06 LAB — CBC
HCT: 35 % — ABNORMAL LOW (ref 36.0–46.0)
Hemoglobin: 11.4 g/dL — ABNORMAL LOW (ref 12.0–15.0)
MCH: 28.3 pg (ref 26.0–34.0)
MCHC: 32.6 g/dL (ref 30.0–36.0)
MCV: 86.8 fL (ref 80.0–100.0)
Platelets: 210 10*3/uL (ref 150–400)
RBC: 4.03 MIL/uL (ref 3.87–5.11)
RDW: 13.7 % (ref 11.5–15.5)
WBC: 8.7 10*3/uL (ref 4.0–10.5)
nRBC: 0 % (ref 0.0–0.2)

## 2020-07-06 LAB — T.PALLIDUM AB, TOTAL: T Pallidum Abs: NONREACTIVE

## 2020-07-06 MED ORDER — IBUPROFEN 600 MG PO TABS
600.0000 mg | ORAL_TABLET | Freq: Four times a day (QID) | ORAL | 0 refills | Status: DC
Start: 2020-07-06 — End: 2020-07-06

## 2020-07-06 MED ORDER — ACETAMINOPHEN 325 MG PO TABS
650.0000 mg | ORAL_TABLET | ORAL | 0 refills | Status: DC | PRN
Start: 2020-07-06 — End: 2020-07-06

## 2020-07-06 MED FILL — ACETAMINOPHEN 325 MG TABS: 325 | 4 days supply | Qty: 30 | Fill #0

## 2020-07-06 MED FILL — IBUPROFEN 600 MG TABLET: 600 | 8 days supply | Qty: 30 | Fill #0

## 2020-07-06 NOTE — Discharge Instructions (Signed)
Parto vaginal, cuidados de puerperio Postpartum Care After Vaginal Delivery Lea esta informacin sobre cmo cuidarse desde el momento en que nazca su beb y hasta 6 a 12 semanas despus del parto (perodo del posparto). El mdico tambin podr darle instrucciones ms especficas. Comunquese con su mdico si tiene problemas o preguntas. Siga estas indicaciones en su casa: Hemorragia vaginal  Es normal tener un poco de hemorragia vaginal (loquios) despus del parto. Use un apsito sanitario para el sangrado vaginal y secrecin. ? Durante la primera semana despus del parto, la cantidad y el aspecto de los loquios a menudo es similar a las del perodo menstrual. ? Durante las siguientes semanas disminuir gradualmente hasta convertirse en una secrecin seca amarronada o amarillenta. ? En la mayora de las mujeres, los loquios se detienen completamente entre 4 a 6semanas despus del parto. Los sangrados vaginales pueden variar de mujer a mujer.  Cambie los apsitos sanitarios con frecuencia. Observe si hay cambios en el flujo, como: ? Un aumento repentino en el volumen. ? Cambio en el color. ? Cogulos sanguneos grandes.  Si expulsa un cogulo de sangre por la vagina, gurdelo y llame al mdico para informrselo. No deseche los cogulos de sangre por el inodoro antes de hablar con su mdico.  No use tampones ni se haga duchas vaginales hasta que el mdico la autorice.  Si no est amamantando, volver a tener su perodo entre 6 y 8 semanas despus del parto. Si solamente alimenta al beb con leche materna (lactancia materna exclusiva), podra no volver a tener su perodo hasta que deje de amamantar. Cuidados perineales  Mantenga la zona entre la vagina y el ano (perineo) limpia y seca, como se lo haya indicado el mdico. Utilice apsitos o aerosoles analgsicos y cremas, como se lo hayan indicado.  Si le hicieron un corte en el perineo (episiotoma) o tuvo un desgarro en la vagina, controle la  zona para detectar signos de infeccin hasta que sane. Est atenta a los siguientes signos: ? Aumento del enrojecimiento, la hinchazn o el dolor. ? Presenta lquido o sangre que supura del corte o desgarro. ? Calor. ? Pus o mal olor.  Es posible que le den una botella rociadora para que use en lugar de limpiarse el rea con papel higinico despus de usar el bao. Cuando comience a sanar, podr usar la botella rociadora antes de secarse. Asegrese de secarse suavemente.  Para aliviar el dolor causado por una episiotoma, un desgarro en la vagina o venas hinchadas en el ano (hemorroides), trate de tomar un bao de asiento tibio 2 o 3 veces por da. Un bao de asiento es un bao de agua tibia que se toma mientras se est sentado. El agua solo debe llegar hasta las caderas y cubrir las nalgas. Cuidado de las mamas  En los primeros das despus del parto, las mamas pueden sentirse pesadas, llenas e incmodas (congestin mamaria). Tambin puede escaparse leche de sus senos. El mdico puede sugerirle mtodos para aliviar este malestar. La congestin mamaria debera desaparecer al cabo de unos das.  Si est amamantando: ? Use un sostn que sujete y ajuste bien sus pechos. ? Mantenga los pezones secos y limpios. Aplquese cremas y ungentos, como se lo haya indicado el mdico. ? Es posible que deba usar discos de algodn en el sostn para absorber la leche que se filtre de sus senos. ? Puede tener contracciones uterinas cada vez que amamante durante varias semanas despus del parto. Las contracciones uterinas ayudan al tero a   regresar a su tamao habitual. ? Si tiene algn problema con la lactancia materna, colabore con el mdico o un asesor en lactancia.  Si no est amamantando: ? Evite tocarse mucho las mamas. Al hacerlo, podran producir ms leche. ? Use un sostn que le proporcione el ajuste correcto y compresas fras para reducir la hinchazn. ? No extraiga (saque) leche materna. Esto har que  produzca ms leche. Intimidad y sexualidad  Pregntele al mdico cundo puede retomar la actividad sexual. Esto puede depender de lo siguiente: ? Su riesgo de sufrir infecciones. ? La rapidez con la que est sanando. ? Su comodidad y deseo de retomar la actividad sexual.  Despus del parto, puede quedar embarazada incluso si no ha tenido todava su perodo. Si lo desea, hable con el mdico acerca de los mtodos de control de la natalidad (mtodos anticonceptivos). Medicamentos  Tome los medicamentos de venta libre y los recetados solamente como se lo haya indicado el mdico.  Si le recetaron un antibitico, tmelo como se lo haya indicado el mdico. No deje de tomar el antibitico aunque comience a sentirse mejor. Actividad  Retome sus actividades normales de a poco como se lo haya indicado el mdico. Pregntele al mdico qu actividades son seguras para usted.  Descanse todo lo que pueda. Trate de descansar o tomar una siesta mientras el beb duerme. Comida y bebida   Beba suficiente lquido como para mantener la orina de color amarillo plido.  Coma alimentos ricos en fibras todos los das. Estos pueden ayudarla a prevenir o aliviar el estreimiento. Los alimentos ricos en fibras incluyen, entre otros: ? Panes y cereales integrales. ? Arroz integral. ? Frijoles. ? Frutas y verduras frescas.  No intente perder de peso rpidamente reduciendo el consumo de caloras.  Tome sus vitaminas prenatales hasta la visita de seguimiento de posparto o hasta que su mdico le indique que puede dejar de tomarlas. Estilo de vida  No consuma ningn producto que contenga nicotina o tabaco, como cigarrillos y cigarrillos electrnicos. Si necesita ayuda para dejar de fumar, consulte al mdico.  No beba alcohol, especialmente si est amamantando. Instrucciones generales  Concurra a todas las visitas de seguimiento para usted y el beb, como se lo haya indicado el mdico. La mayora de las mujeres  visita al mdico para un seguimiento de posparto dentro de las primeras 3 a 6 semanas despus del parto. Comunquese con un mdico si:  Se siente incapaz de controlar los cambios que implica tener un hijo y esos sentimientos no desaparecen.  Siente tristeza o preocupacin de forma inusual.  Las mamas se ponen rojas, le duelen o se endurecen.  Tiene fiebre.  Tiene dificultad para retener la orina o para impedir que la orina se escape.  Tiene poco inters o falta de inters en actividades que solan gustarle.  No ha amamantado nada y no ha tenido un perodo menstrual durante 12 semanas despus del parto.  Dej de amamantar al beb y no ha tenido su perodo menstrual durante 12 semanas despus de dejar de amamantar.  Tiene preguntas sobre su cuidado y el del beb.  Elimina un cogulo de sangre grande por la vagina. Solicite ayuda de inmediato si:  Siente dolor en el pecho.  Tiene dificultad para respirar.  Tiene un dolor repentino e intenso en la pierna.  Tiene dolor intenso o clicos en el la parte inferior del abdomen.  Tiene una hemorragia tan intensa de la vagina que empapa ms de un apsito en una hora. El sangrado   no debe ser ms abundante que el perodo ms intenso que haya tenido.  Dolor de cabeza intenso.  Se desmaya.  Tiene visin borrosa o manchas en la vista.  Tiene secrecin vaginal con mal olor.  Tiene pensamientos acerca de lastimarse a usted misma o a su beb. Si alguna vez siente que puede lastimarse a usted misma o a otras personas, o tiene pensamientos de poner fin a su vida, busque ayuda de inmediato. Puede dirigirse al departamento de emergencias ms cercano o llamar a:  El servicio de emergencias de su localidad (911 en [place]  Una lnea de asistencia al suicida y atencin en crisis, como la Lnea Nacional de Prevencin del Suicidio (National Suicide Prevention Lifeline), al 1-800-273-8255. Est disponible las 24 horas del da. Resumen  El  perodo de tiempo justo despus el parto y hasta 6 a 12 semanas despus del parto se denomina perodo posparto.  Retome sus actividades normales de a poco como se lo haya indicado el mdico.  Concurra a todas las visitas de seguimiento para usted y el beb, como se lo haya indicado el mdico. Esta informacin no tiene como fin reemplazar el consejo del mdico. Asegrese de hacerle al mdico cualquier pregunta que tenga. Document Revised: 12/01/2017 Document Reviewed: 08/12/2017 Elsevier Patient Education  2020 Elsevier Inc. Depresin posparto Postpartum Baby Blues El perodo del posparto comienza inmediatamente despus del nacimiento de un beb. Este tiempo suele ser una poca de gran felicidad y mucho entusiasmo. Tambin es tiempo de muchos cambios en la vida de los padres. Independientemente de cuntas veces una madre d a luz, cada nio trae nuevos desafos a la familia, incluidas nuevas formas de relacionarse con los dems. Es frecuente tener sentimientos de entusiasmo y, a la vez, cambios desconcertantes en el estado de nimo, las emociones y los pensamientos. Es posible que se sienta feliz un minuto y triste o estresada el siguiente. Estos sentimientos de tristeza suelen ocurrir inmediatamente despus del nacimiento y desaparecen en un lapso de una o dos semanas. Esto se llama "depresin posparto". Cules son las causas? La depresin posparto no tiene una causa conocida. Probablemente sea consecuencia de una combinacin de factores. Sin embargo, se cree que los cambios en los niveles hormonales ocurridos despus del nacimiento desencadenan algunos de los sntomas. Otros factores que pueden afectar estos cambios anmicos incluyen los siguientes:  Falta de sueo.  Sucesos estresantes de la vida, como la pobreza, el cuidado de un ser querido o la muerte de un ser querido.  Factores genticos. Cules son los signos o los sntomas? Los sntomas de esta afeccin incluyen los  siguientes:  Cambios en el estado de nimo durante lapsos breves, como pasar de la felicidad extrema a la tristeza.  Falta de concentracin.  Dificultad para dormir.  Ataques de llanto y sensibilidad emocional.  Prdida del apetito.  Irritabilidad.  Ansiedad. Si los sntomas de la depresin posparto duran ms de 2semanas o empeoran, podra tener una forma ms grave de depresin posparto. Cmo se diagnostica? Esta afeccin se diagnostica en funcin de una evaluacin de los sntomas. No hay exmenes mdicos ni pruebas de laboratorio que permitan hacer un diagnstico, pero hay varios cuestionarios que el mdico podra usar para determinar si una mujer tiene depresin posparto o una forma ms grave de esta. Cmo se trata? No se requiere tratamiento para esta afeccin. Generalmente, la depresin posparto desaparece sin tratamiento en el trmino de 1 o 2semanas. A menudo, todo lo que se necesita es apoyo social. Se le recomendar que   duerma y descanse lo suficiente. Siga estas indicaciones en su casa: Estilo de vida      Descanse todo lo posible. Tome una siesta cuando el beb duerma.  Haga actividad fsica habitualmente como se lo haya indicado el mdico. Para algunas mujeres, el yoga y las caminatas son tiles.  Consuma una dieta equilibrada y nutritiva. Esto incluye muchas frutas y verduras, cereales integrales y protenas magras.  Haga pequeas cosas que disfruta. Tome una taza de t, dese un bao de burbujas, lea su revista favorita o escuche su msica predilecta.  Evite el alcohol.  Pida ayuda con las tareas domsticas, la cocina, las compras, o las obligaciones diarias. No intente hacer todo usted misma. Considere la posibilidad de contratar a una doula para que la ayude y asesore. Se trata de una profesional que se especializa en asistir a nuevas madres.  Intente no hacer ningn cambio importante durante el embarazo ni inmediatamente despus del nacimiento. Esto podra  sumar estrs. Instrucciones generales  Hable con personas allegadas sobre cmo se siente. Busque el apoyo de su pareja, sus familiares, sus amigos o de otras madres primerizas. Quiz quiera unirse a un grupo de apoyo.  Encuentre formas de lidiar con el estrs. Esto puede incluir lo siguiente: ? Escribir sus pensamientos y sentimientos en un diario. ? Pasar tiempo al aire libre. ? Pasar tiempo con personas que la hagan rer.  Intente pensar positivamente. Piense en aquellas cosas por las que se siente agradecida.  Tome los medicamentos de venta libre y los recetados solamente como se lo haya indicado el mdico.  Infrmele a su mdico sobre cualquier inquietud que tenga.  Concurrir a todas las visitas durante el posparto tal como se lo haya indicado el mdico. Esto es importante. Comunquese con un mdico si:  La depresin posparto no desaparece despus de 2semanas. Solicite ayuda de inmediato si:  Tiene pensamientos sobre quitarse la vida (pensamientos suicidas).  Cree que podra lastimar al beb o a otras personas.  Ve u oye cosas que no existen (alucinaciones). Resumen  Despus de dar a luz, es posible que se sienta feliz un minuto y triste o estresada el siguiente. Los sentimientos de tristeza que ocurren inmediatamente despus de que el beb nace y desaparecen luego de una o dos semanas se llaman "depresin posparto".  Puede controlar la depresin posparto descansando lo suficiente, siguiendo una dieta saludable, realizando actividad fsica, pasando tiempo con personas que le den apoyo y encontrando modos de lidiar con el estrs.  Si los sentimientos de tristeza o estrs duran ms de 2semanas o perjudican el cuidado que le brinda al beb, hable con el mdico. Esto podra significar que tiene un caso ms grave de depresin posparto. Esta informacin no tiene como fin reemplazar el consejo del mdico. Asegrese de hacerle al mdico cualquier pregunta que tenga. Document Revised:  11/19/2017 Document Reviewed: 05/04/2017 Elsevier Patient Education  2020 Elsevier Inc.   

## 2020-07-07 ENCOUNTER — Encounter: Payer: Self-pay | Admitting: Obstetrics and Gynecology

## 2020-07-07 DIAGNOSIS — R768 Other specified abnormal immunological findings in serum: Secondary | ICD-10-CM | POA: Insufficient documentation

## 2020-08-05 ENCOUNTER — Ambulatory Visit: Payer: Self-pay | Admitting: Nurse Practitioner

## 2021-09-19 IMAGING — US US OB COMP LESS 14 WK
1 series · 15 of 21 positions shown · non-contrast
Comparison: None.

CLINICAL DATA: Abdominal pain today. Pregnant patient. Patient is
10 weeks and 4 days pregnant based on her last menstrual period.

EXAM:
OBSTETRIC <14 WK ULTRASOUND
TECHNIQUE: Transabdominal ultrasound was performed for evaluation of the
gestation as well as the maternal uterus and adnexal regions.

[Series 1: us ob comp less 14 wk · 15 of 21 slices shown]
[im 1/21]
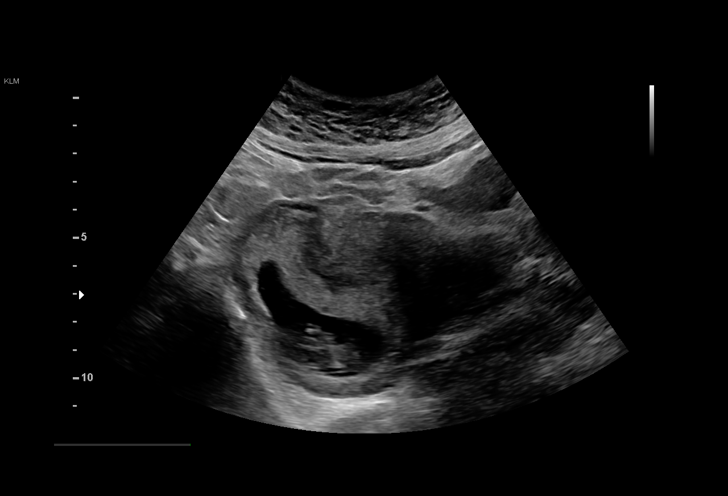
[im 3/21]
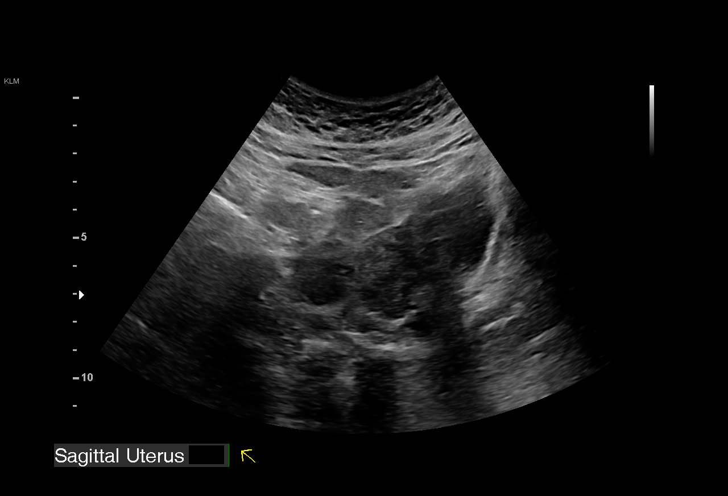
[im 4/21]
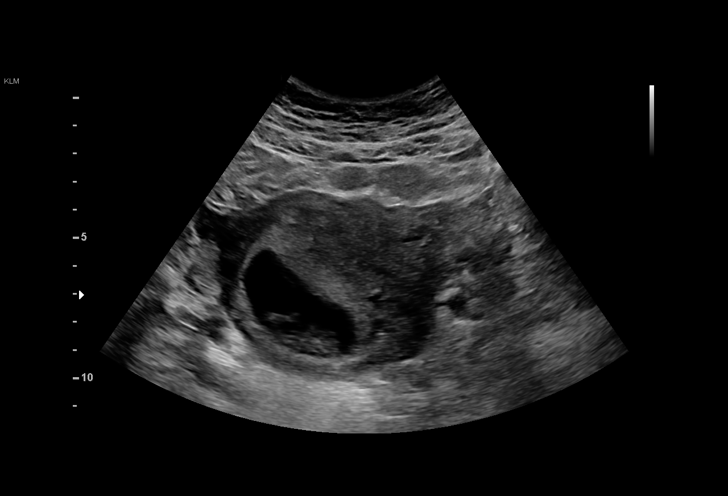
[im 5/21]
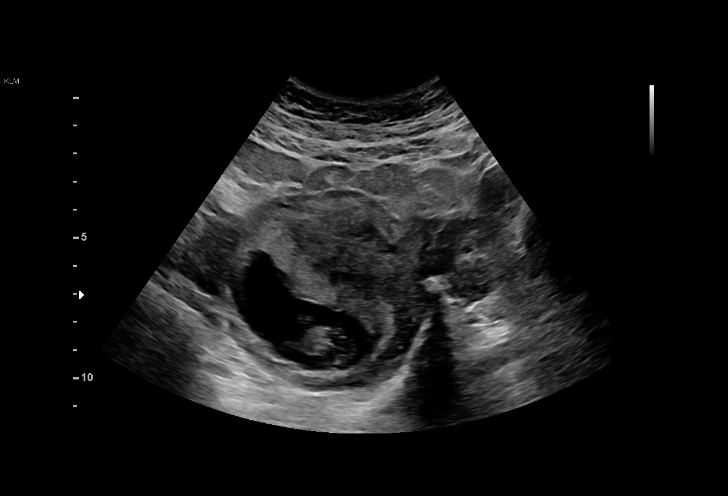
[im 7/21]
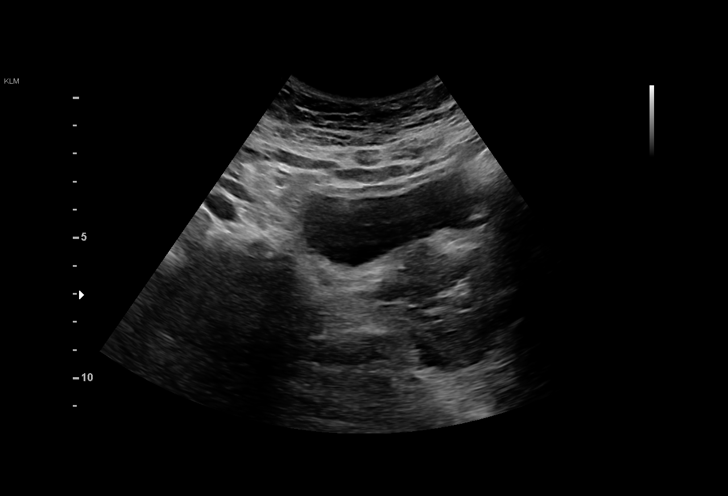
[im 8/21]
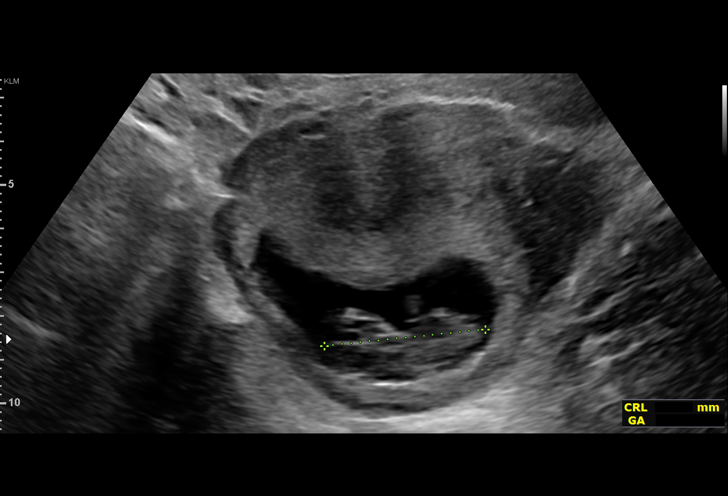
[im 10/21]
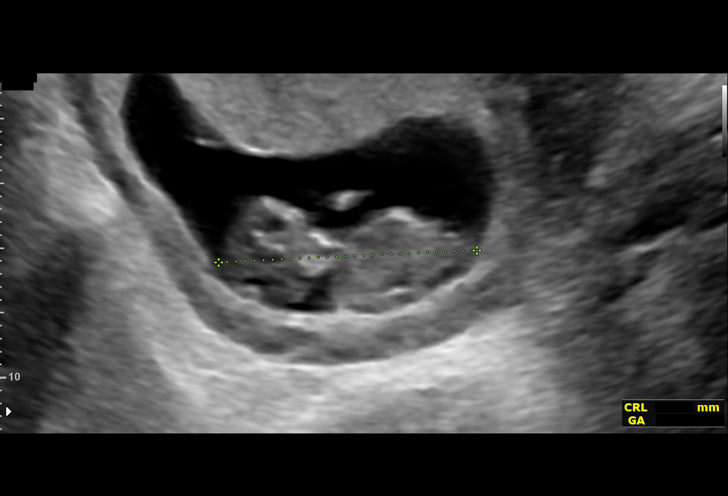
[im 11/21]
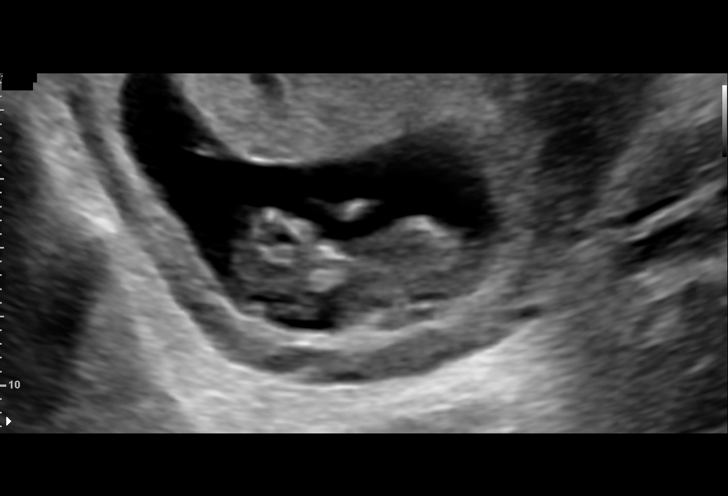
[im 12/21]
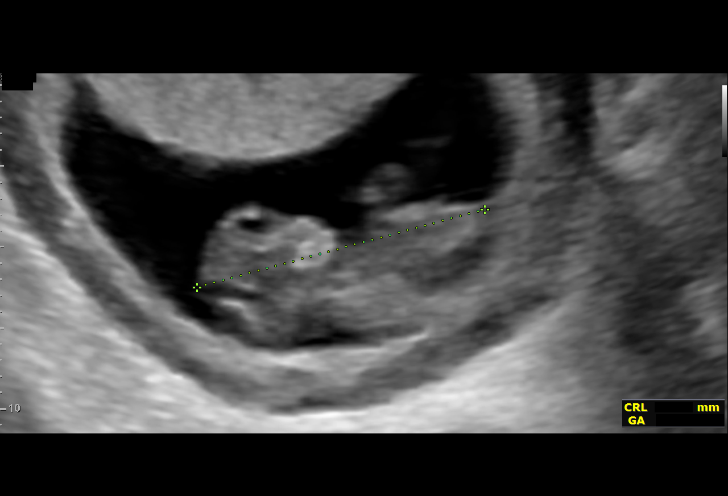
[im 14/21]
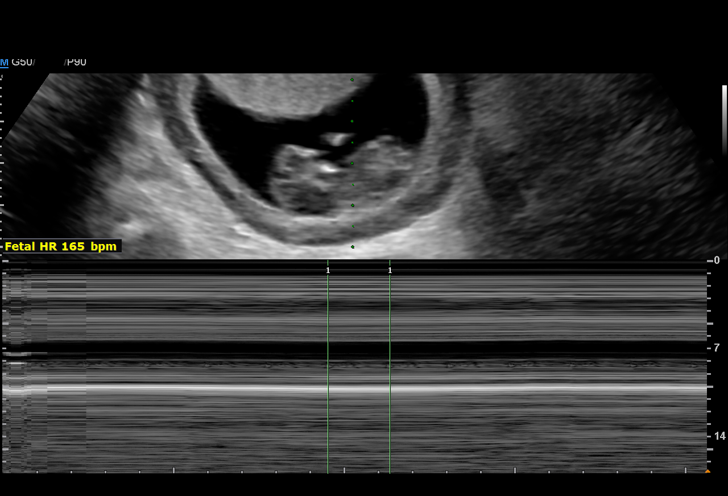
[im 15/21]
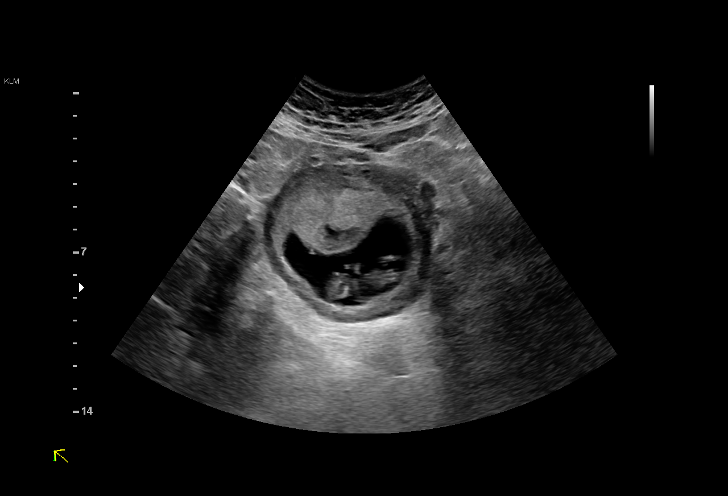
[im 17/21]
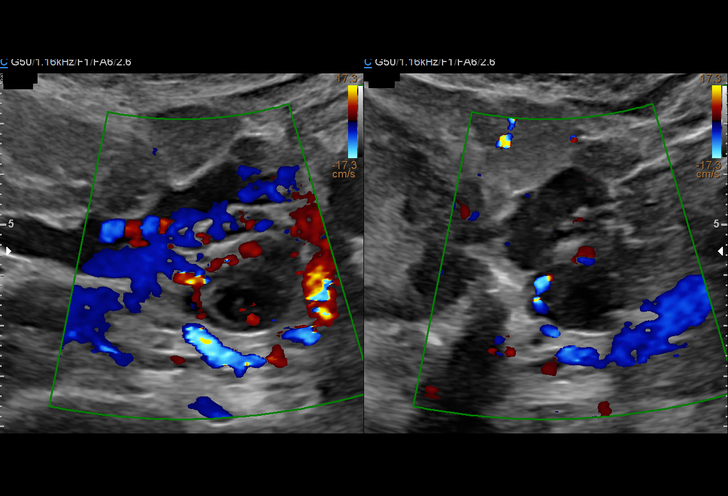
[im 18/21]
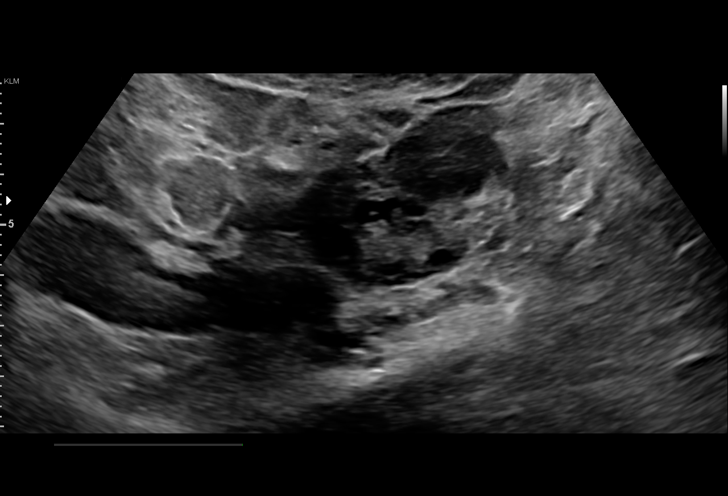
[im 19/21]
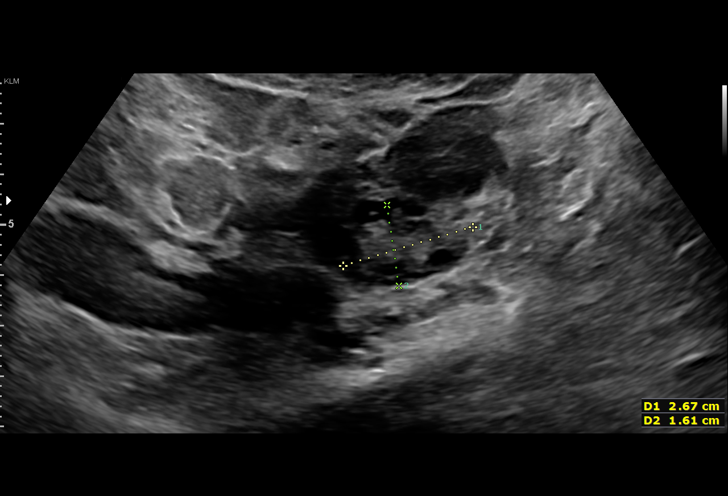
[im 21/21]
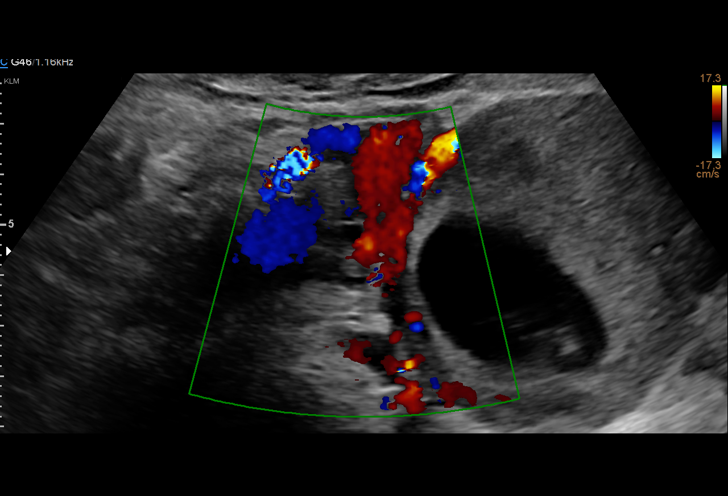

[15 of 21 positions shown; findings below may reference images not displayed]

FINDINGS: Intrauterine gestational sac: Single

Yolk sac:  Visualized.

Embryo:  Visualized.

Cardiac Activity: Visualized.

Heart Rate: 165 bpm

CRL:   38.76 mm   10 w 5 d                  US EDC: 07/17/2019

Subchorionic hemorrhage:  None visualized.

Maternal uterus/adnexae: No uterine masses. Cervix is closed. Normal
ovaries and adnexa. No pelvic free fluid.
IMPRESSION: 1. Single live intrauterine pregnancy with a measured gestational
age of 10 weeks and 4 days, consistent with the expected gestational
age based on the last menstrual period. No pregnancy complication.

## 2022-07-03 ENCOUNTER — Inpatient Hospital Stay (HOSPITAL_COMMUNITY)
Admission: AD | Admit: 2022-07-03 | Discharge: 2022-07-03 | Disposition: A | Discharge status: Home / Self Care | Payer: Self-pay | Attending: Obstetrics & Gynecology | Admitting: Obstetrics & Gynecology

## 2022-07-03 ENCOUNTER — Encounter (HOSPITAL_COMMUNITY): Payer: Self-pay | Admitting: Obstetrics & Gynecology

## 2022-07-03 ENCOUNTER — Inpatient Hospital Stay (HOSPITAL_COMMUNITY): Payer: Self-pay

## 2022-07-03 DIAGNOSIS — O4691 Antepartum hemorrhage, unspecified, first trimester: Secondary | ICD-10-CM | POA: Insufficient documentation

## 2022-07-03 DIAGNOSIS — Z3A11 11 weeks gestation of pregnancy: Secondary | ICD-10-CM | POA: Insufficient documentation

## 2022-07-03 DIAGNOSIS — O039 Complete or unspecified spontaneous abortion without complication: Secondary | ICD-10-CM

## 2022-07-03 LAB — HCG, QUANTITATIVE, PREGNANCY: hCG, Beta Chain, Quant, S: 1240 m[IU]/mL — ABNORMAL HIGH (ref <5)

## 2022-07-03 MED ORDER — MISOPROSTOL 200 MCG PO TABS: 800.0000 ug | ORAL_TABLET | Freq: Once | ORAL | 0 refills | Status: DC

## 2022-07-03 MED ORDER — MISOPROSTOL 200 MCG PO TABS
800.0000 ug | ORAL_TABLET | Freq: Once | ORAL | Status: AC
  Administered 2022-07-03: 800 ug via ORAL
  Filled 2022-07-03: qty 4

## 2022-07-03 NOTE — MAU Provider Note (Signed)
History     CSN: [id number]  Arrival date and time: 07/03/22 0143   Event Date/Time   First Provider Initiated Contact with Patient 07/03/22 [phone]      Chief Complaint  Patient presents with   Vaginal Bleeding   Patient presents for evaluation for vaginal bleeding.  She reports that approximately 2 days ago she noticed some brownish discharge and again yesterday.  At approximately midnight she noticed bright red blood from her vagina was soaked through 3 pads since it started.  She reports that she is [redacted] weeks pregnant and is scheduled to have her first visit with the health department for prenatal care.  This is her sixth pregnancy.  Denies any other symptoms other than the bleeding and pelvic pain.  Denies any contractions, gush of fluid.    OB History     Gravida  5   Para  5   Term  5   Preterm  0   AB  0   Living  5      SAB  0   IAB  0   Ectopic  0   Multiple  0   Live Births  5           Past Medical History:  Diagnosis Date   Anemia    with pregnancy   Medical history non-contributory     Past Surgical History:  Procedure Laterality Date   CHOLECYSTECTOMY N/A 03/29/2017   Procedure: LAPAROSCOPIC CHOLECYSTECTOMY;  Surgeon: Berna Bue, MD;  Location: [facility] OR;  Service: General;  Laterality: N/A;   LAPAROSCOPIC CHOLECYSTECTOMY  03/29/2017    Family History  Problem Relation Age of Onset   Diabetes Mother    Diabetes Father     Social History   Tobacco Use   Smoking status: Never   Smokeless tobacco: Never  Vaping Use   Vaping Use: Never used  Substance Use Topics   Alcohol use: No   Drug use: No    Allergies: No Known Allergies  Medications Prior to Admission  Medication Sig Dispense Refill Last Dose   Prenatal Vit-Fe Fumarate-FA (PRENATAL MULTIVITAMIN) TABS Take 1 tablet by mouth at bedtime.    07/02/2022    Review of Systems  Constitutional:  Negative for fever.  HENT:  Negative for congestion.   Eyes:  Negative  for visual disturbance.  Respiratory:  Negative for shortness of breath.   Cardiovascular:  Negative for chest pain.  Gastrointestinal:  Positive for abdominal pain.  Endocrine: Negative for polyuria.  Genitourinary:  Positive for vaginal bleeding.  Musculoskeletal:  Negative for back pain.  Neurological:  Negative for headaches.   Physical Exam   Blood pressure 104/60, pulse 79, temperature 98.2 F (36.8 C), temperature source Oral, resp. rate 17, height 4' 9.09" (1.45 m), weight 72 kg, SpO2 94 %, unknown if currently breastfeeding.  Physical Exam HENT:     Head: Normocephalic and atraumatic.     Nose: Nose normal.     Mouth/Throat:     Mouth: Mucous membranes are moist.  Eyes:     Extraocular Movements: Extraocular movements intact.  Cardiovascular:     Rate and Rhythm: Normal rate and regular rhythm.     Pulses: Normal pulses.  Pulmonary:     Effort: Pulmonary effort is normal.  Abdominal:     General: There is no distension.     Palpations: Abdomen is soft.     Tenderness: There is no abdominal tenderness.  Musculoskeletal:  General: Normal range of motion.  Skin:    General: Skin is warm.     Capillary Refill: Capillary refill takes less than 2 seconds.  Neurological:     General: No focal deficit present.     Mental Status: She is alert.  Psychiatric:        Mood and Affect: Mood normal.     MAU Course  Procedures  MDM OB ultrasound Beta-hCG quant   Assessment and Plan  Patient is 36 year old G6, P5 presenting for vaginal bleeding.  Hemodynamically stable on arrival.  Beta-hCG quant showing 1200.  Ultrasound showing intrauterine gestational sac demonstrating migration to lower uterine segment consistent with an abortion in progress.  Discussed case with on-call attending and provided patient with options regarding neck steps in care.  Discussed conservative measures versus medication treatment and patient elected for treatment with Cytotec.  800 mg of  Cytotec given.  Discussed strict return precautions for the patient and the patient was discharged.  Did discuss with patient possible need for second dose and second dose sent to patient's pharmacy.  Message sent to our clinic to schedule post SAB follow-up visit.  No further questions or concerns.  Concepcion Living 07/03/2022, 4:48 AM

## 2022-07-03 NOTE — Discharge Instructions (Signed)
It was a pleasure seeing you tonight.  We performed an ultrasound to evaluate the viability of your fetus and noted miscarriage.  I gave you medication called Cytotec to help with the miscarriage.  If you are still bleeding heavily in 6 hours please take the second dose of medication which I sent to your pharmacy.  If your bleeding worsens or continues please be reevaluated here at the maternal assessment unit.  You will need to have a follow-up with repeat lab work in approximately 2 weeks.  I sent a message to our clinic to have this scheduled.  If you have any questions or concerns he can return or call the health department.

## 2022-07-03 NOTE — MAU Note (Signed)
.  Haley Lin is a 36 y.o. at Unknown here in MAU reporting: Pt reports to be [redacted]wks pregnant and goes to the health department. Pt states that starting Saturday afternoon she noticed some brown discharged. Pt stated that she decided to rest for the rest of the day and Sunday. Pt states that on Sunday around 9pm she noticed more brown discharge. Pt states that around midnight she woke up to bright red bleeding and clots. Pt states she has gone through 3 medium pads since then. No recent intercourse.  Pain score: 5/10 lower abdomen pain (waist area) cramping.  Vitals:   07/03/22 0228  BP: 104/60  Pulse: 79  Resp: 17  Temp: 98.2 F (36.8 C)  SpO2: 94%     FHT: Lab orders placed from triage:   UA.

## 2022-07-18 ENCOUNTER — Ambulatory Visit (INDEPENDENT_AMBULATORY_CARE_PROVIDER_SITE_OTHER): Payer: Self-pay

## 2022-07-18 VITALS — BP 104/69 | HR 64 | Ht <= 58 in | Wt 160.0 lb

## 2022-07-18 DIAGNOSIS — O039 Complete or unspecified spontaneous abortion without complication: Secondary | ICD-10-CM

## 2022-07-18 NOTE — Progress Notes (Signed)
   GYNECOLOGY PROGRESS NOTE  History:  36 y.o. [id number] presents to [facility] office today for SAB follow up. She reports no concerns. She was given Cytotec on 10/30 and had a lot of bleeding and cramping the day she was given the medication. She reports she bled for about 1 week. She is no longer having bleeding or pain. This was an unplanned pregnancy. She does not want any more children. She and her husband were using condoms.    The following portions of the patient's history were reviewed and updated as appropriate: allergies, current medications, past family history, past medical history, past social history, past surgical history and problem list. Last pap smear was at the [facility] in 2022 and was normal per patient.  Health Maintenance Due  Topic Date Due   COVID-19 Vaccine (1) Never done   Hepatitis C Screening  Never done   TETANUS/TDAP  Never done   PAP SMEAR-Modifier  Never done   INFLUENZA VACCINE  Never done     Review of Systems:  Pertinent items are noted in HPI.   Objective:  Physical Exam Blood pressure 104/69, pulse 64, height 4\' 9"  (1.448 m), weight 160 lb (72.6 kg), not currently breastfeeding. VS reviewed, nursing note reviewed,  Constitutional: well developed, well nourished, no distress HEENT: normocephalic CV: normal rate Pulm/chest wall: normal effort Breast Exam: deferred Abdomen: soft Neuro: alert and oriented x 3 Skin: warm, dry Psych: affect normal Pelvic exam: deferred  Assessment & Plan:  1. SAB (spontaneous abortion) - S/p Cytotec. Will check bHCG today - Pap up to date per patient. Will defer to HD for any necessary follow up given she does not have insurance - She declines contraception today reporting she will contact the HD - Spanish interpretor used for entirety of today's visit  - Beta hCG quant (ref lab)   No follow-ups on file.   , CNM 9:52 AM

## 2022-07-18 NOTE — Patient Instructions (Signed)
[facility] Department of [employer] Vasectomy Program  [phone]

## 2022-07-19 LAB — BETA HCG QUANT (REF LAB): hCG Quant: 3 m[IU]/mL
